# Patient Record
Sex: Female | Born: 1970 | Race: Black or African American | Hispanic: No | Marital: Single | Smoking: Never smoker
Health system: Southern US, Community
[De-identification: ages and names within clinical notes are randomized; demographics above are authoritative.]

## PROBLEM LIST (undated history)

## (undated) DIAGNOSIS — Z9889 Other specified postprocedural states: Secondary | ICD-10-CM

## (undated) DIAGNOSIS — N840 Polyp of corpus uteri: Secondary | ICD-10-CM

## (undated) DIAGNOSIS — D259 Leiomyoma of uterus, unspecified: Secondary | ICD-10-CM

## (undated) DIAGNOSIS — R112 Nausea with vomiting, unspecified: Secondary | ICD-10-CM

## (undated) DIAGNOSIS — N920 Excessive and frequent menstruation with regular cycle: Secondary | ICD-10-CM

## (undated) DIAGNOSIS — Z6721 Type B blood, Rh negative: Secondary | ICD-10-CM

## (undated) DIAGNOSIS — S52571A Other intraarticular fracture of lower end of right radius, initial encounter for closed fracture: Secondary | ICD-10-CM

## (undated) DIAGNOSIS — D649 Anemia, unspecified: Secondary | ICD-10-CM

## (undated) DIAGNOSIS — R9389 Abnormal findings on diagnostic imaging of other specified body structures: Secondary | ICD-10-CM

## (undated) HISTORY — DX: Type B blood, Rh negative: Z67.21

## (undated) HISTORY — DX: Anemia, unspecified: D64.9

## (undated) HISTORY — PX: WISDOM TOOTH EXTRACTION: SHX21

---

## 1998-04-09 ENCOUNTER — Encounter: Payer: Self-pay | Admitting: Emergency Medicine

## 1998-04-09 ENCOUNTER — Emergency Department (HOSPITAL_COMMUNITY): Admission: EM | Admit: 1998-04-09 | Discharge: 1998-04-09 | Payer: Self-pay | Admitting: Emergency Medicine

## 1998-05-14 ENCOUNTER — Other Ambulatory Visit: Admission: RE | Admit: 1998-05-14 | Discharge: 1998-05-14 | Payer: Self-pay | Admitting: Obstetrics and Gynecology

## 1999-06-25 ENCOUNTER — Other Ambulatory Visit: Admission: RE | Admit: 1999-06-25 | Discharge: 1999-06-25 | Payer: Self-pay | Admitting: Gynecology

## 1999-11-15 ENCOUNTER — Other Ambulatory Visit: Admission: RE | Admit: 1999-11-15 | Discharge: 1999-11-15 | Payer: Self-pay | Admitting: Gynecology

## 1999-12-23 ENCOUNTER — Inpatient Hospital Stay (HOSPITAL_COMMUNITY): Admission: AD | Admit: 1999-12-23 | Discharge: 1999-12-26 | Payer: Self-pay | Admitting: Gynecology

## 2000-07-28 ENCOUNTER — Emergency Department (HOSPITAL_COMMUNITY): Admission: EM | Admit: 2000-07-28 | Discharge: 2000-07-28 | Payer: Self-pay | Admitting: Emergency Medicine

## 2000-11-23 ENCOUNTER — Other Ambulatory Visit: Admission: RE | Admit: 2000-11-23 | Discharge: 2000-11-23 | Payer: Self-pay | Admitting: Gynecology

## 2002-02-09 ENCOUNTER — Emergency Department (HOSPITAL_COMMUNITY): Admission: EM | Admit: 2002-02-09 | Discharge: 2002-02-10 | Payer: Self-pay | Admitting: Emergency Medicine

## 2002-02-10 ENCOUNTER — Encounter: Payer: Self-pay | Admitting: Emergency Medicine

## 2003-03-13 ENCOUNTER — Other Ambulatory Visit: Admission: RE | Admit: 2003-03-13 | Discharge: 2003-03-13 | Payer: Self-pay | Admitting: Gynecology

## 2003-04-28 ENCOUNTER — Emergency Department (HOSPITAL_COMMUNITY): Admission: EM | Admit: 2003-04-28 | Discharge: 2003-04-28 | Payer: Self-pay | Admitting: Emergency Medicine

## 2004-05-24 ENCOUNTER — Other Ambulatory Visit: Admission: RE | Admit: 2004-05-24 | Discharge: 2004-05-24 | Payer: Self-pay | Admitting: Obstetrics and Gynecology

## 2004-12-27 ENCOUNTER — Encounter: Admission: RE | Admit: 2004-12-27 | Discharge: 2004-12-27 | Payer: Self-pay | Admitting: Gynecology

## 2005-03-22 ENCOUNTER — Emergency Department (HOSPITAL_COMMUNITY): Admission: EM | Admit: 2005-03-22 | Discharge: 2005-03-22 | Payer: Self-pay | Admitting: Emergency Medicine

## 2005-10-06 ENCOUNTER — Other Ambulatory Visit: Admission: RE | Admit: 2005-10-06 | Discharge: 2005-10-06 | Payer: Self-pay | Admitting: Gynecology

## 2005-10-17 ENCOUNTER — Emergency Department (HOSPITAL_COMMUNITY): Admission: EM | Admit: 2005-10-17 | Discharge: 2005-10-18 | Payer: Self-pay | Admitting: Emergency Medicine

## 2006-11-16 ENCOUNTER — Other Ambulatory Visit: Admission: RE | Admit: 2006-11-16 | Discharge: 2006-11-16 | Payer: Self-pay | Admitting: Gynecology

## 2007-11-21 ENCOUNTER — Other Ambulatory Visit: Admission: RE | Admit: 2007-11-21 | Discharge: 2007-11-21 | Payer: Self-pay | Admitting: Gynecology

## 2008-03-04 ENCOUNTER — Emergency Department (HOSPITAL_COMMUNITY): Admission: EM | Admit: 2008-03-04 | Discharge: 2008-03-04 | Payer: Self-pay | Admitting: Emergency Medicine

## 2008-12-11 ENCOUNTER — Encounter: Payer: Self-pay | Admitting: Women's Health

## 2008-12-11 ENCOUNTER — Ambulatory Visit: Payer: Self-pay | Admitting: Women's Health

## 2008-12-11 ENCOUNTER — Other Ambulatory Visit: Admission: RE | Admit: 2008-12-11 | Discharge: 2008-12-11 | Payer: Self-pay | Admitting: Gynecology

## 2009-05-12 ENCOUNTER — Ambulatory Visit: Payer: Self-pay | Admitting: Gynecology

## 2009-05-13 ENCOUNTER — Encounter: Admission: RE | Admit: 2009-05-13 | Discharge: 2009-05-13 | Payer: Self-pay | Admitting: Gynecology

## 2009-12-16 ENCOUNTER — Ambulatory Visit: Payer: Self-pay | Admitting: Women's Health

## 2009-12-16 ENCOUNTER — Other Ambulatory Visit: Admission: RE | Admit: 2009-12-16 | Discharge: 2009-12-16 | Payer: Self-pay | Admitting: Gynecology

## 2010-04-23 ENCOUNTER — Other Ambulatory Visit: Payer: Self-pay | Admitting: Gynecology

## 2010-04-23 DIAGNOSIS — Z1239 Encounter for other screening for malignant neoplasm of breast: Secondary | ICD-10-CM

## 2010-05-20 ENCOUNTER — Ambulatory Visit: Payer: Self-pay

## 2010-06-02 ENCOUNTER — Ambulatory Visit: Payer: Self-pay

## 2010-06-08 ENCOUNTER — Ambulatory Visit
Admission: RE | Admit: 2010-06-08 | Discharge: 2010-06-08 | Disposition: A | Discharge status: Home / Self Care | Payer: BC Managed Care – PPO | Source: Ambulatory Visit | Attending: Gynecology | Admitting: Gynecology

## 2010-06-08 DIAGNOSIS — Z1239 Encounter for other screening for malignant neoplasm of breast: Secondary | ICD-10-CM

## 2010-08-13 NOTE — Discharge Summary (Signed)
[facility]  Patient:    Courtney Bowman, Courtney Bowman                      MRN: [id number] Adm. Date:  [id number] Disc. Date: [id number] Attending:  Tonye Royalty Dictator:   Antony Contras, [facility]                           Discharge Summary  DISCHARGE DIAGNOSES:          1. Intrauterine pregnancy at term.                               2. History of previous cesarean section.                               3. Rh-negative.  PROCEDURES:                   Low cervical transverse cesarean section with delivery of viable infant.  HISTORY OF PRESENT ILLNESS:   The patient is a 40 year old gravida 3, para 1-0-1-1 with an uncertain LMP of March 29, 1999, [facility] December 27, 1999 per ultrasound.  Prenatal risk factors include history of previous cesarean section.  The patient is also Rh-negative.  PRENATAL LABORATORY:          Blood type B-; antibody screen negative; sickle cell trait negative; RPR, HBsAg, HIV nonreactive; rubella immune; MSAFP is normal; GBS is negative.  HOSPITAL COURSE/TREATMENT:    The patient was admitted for repeat cesarean section on December 23, 1999, which was performed by Dr. Lily Peer, assisted by Dr. Penni Homans.  She was delivered of a female infant, Apgars 8 and 9 weighing 8 pounds 7 ounces.  Normal pelvic anatomy.  POSTOPERATIVE COURSE:         She remained afebrile.  Had no difficulty voiding.  Was able to be discharged on her third postoperative day in satisfactory condition.  CBC:  Hematocrit 32.1, hemoglobin 11.4, WBCs 14.5, platelets 206.  Infant was B negative, so she did not require RhoGAM.  DISPOSITION:                  Follow-up in the office in six weeks.  Continue prenatal vitamins and iron.  Motrin and Tylox for pain. DD:  01/14/00 TD:  01/14/00 Job: [id number]

## 2010-08-13 NOTE — H&P (Signed)
[facility]  Patient:    Courtney Bowman, Courtney Bowman                        MRN: [id number] Adm. Date:  12/23/99 Attending:  Gaetano Hawthorne. Lily Peer, M.D.                         History and Physical  CHIEF COMPLAINT:              1. Term intrauterine pregnancy.                               2. Previous cesarean section requesting elective                                  repeat.  HISTORY OF PRESENT ILLNESS:   The patient is a 40 year old, gravida 3, para 1, AB 1, currently 39-2/7ths weeks gestation who had a cesarean section with her last pregnancy secondary to failure to progress after 10 hours of labor, also fetal tachycardia and chorioamnionitis.  Patient had been provided with literature information as well as discussion of option for trial of labor versus repeat cesarean section; and, since her last baby weighed at 8 pounds, 4 ounces, and she had an ultrasound in the office on September 25 and the weight of the fetus is near the weight of the last pregnancy, they have decided to proceed with a repeat cesarean section.  They were uncertain at the time of the last office visit and would let me know right before the cesarean section if they wished to proceed with a tubal sterilization procedure at time of her repeat cesarean section.  Prenatal course has essentially been unremarkable except that she received RhoGAM at [redacted] weeks gestation and had anemia which she was supplemented with iron supplementation.  PAST MEDICAL HISTORY:         Previous cesarean section secondary to failure to progress in 1996 a viable female infant, 8 pounds, 4 ounces, and then, in March 2000, she had a therapeutic AB at five weeks gestation.  Patient denies any allergies.  REVIEW OF SYSTEMS:            See ______ form.  PHYSICAL EXAMINATION:  VITAL SIGNS:                  Blood pressure 120/70.  Urine was negative. Weight 193 pounds.  HEENT:                         Unremarkable.  NECK:                         Supple.  Trachea midline.  No carotid bruits. No thyromegaly.  LUNGS:                        Clear to auscultation without rhonchi or wheezes.  HEART:                        Regular rate and rhythm.  No murmurs or gallops.  BREASTS:                      Examination was done during the first  trimester and was reported to be normal.  ABDOMEN:                      Gravid uterus.  Fundal height 38 cm.  Vertex presentation by [facility] maneuver.  Positive fetal heart tones.  PELVIC:                       Cervix is long, closed, and posterior. Presenting part not engaged.  EXTREMITIES:                  DTRs 1+.  Negative clonus.  PRENATAL LABORATORIES:        B negative blood type and negative antibody screen.  Sickle cell trait was negative.  VDRL was nonreactive.  Hepatitis B surface antigen and HIV were nonreactive.  Alpha-fetoprotein was normal.  Pap smear:  Atypical squamous cells suspicious for CIN I.  Patient had a follow-up Pap smear which demonstrated nonreactive repetitive changes.  No significant atypia.  Alpha-fetoprotein was normal, group B strep culture was negative, and diabetes screen was normal.  She did have evidence of anemia.  ASSESSMENT:                   A 40 year old, gravida 3, para 1, AB 1 at 39-2/[redacted] weeks gestation with previous cesarean section with failure to progress. Requested elective repeat.  Patient will let me know prior to her surgery if her and her husband have decided to proceed with elective permanent sterilization for which she was counseled as to risks, benefits, pros and cons of procedure as outlined.  If she does decide to proceed with a sterilization procedure, she is fully aware that she will not be able to have any more children.  All questions were answered and will follow accordingly.  PLAN:                         Patient is scheduled for a repeat cesarean section tomorrow, September 27, at 1  p.m. at [facility], possible bilateral tubal sterilization. DD:  12/22/99 TD:  12/22/99 Job: [id number]

## 2011-06-02 ENCOUNTER — Other Ambulatory Visit: Payer: Self-pay | Admitting: Gynecology

## 2011-06-02 DIAGNOSIS — Z1231 Encounter for screening mammogram for malignant neoplasm of breast: Secondary | ICD-10-CM

## 2011-06-14 ENCOUNTER — Ambulatory Visit
Admission: RE | Admit: 2011-06-14 | Discharge: 2011-06-14 | Disposition: A | Discharge status: Home / Self Care | Payer: 59 | Source: Ambulatory Visit | Attending: Gynecology | Admitting: Gynecology

## 2011-06-14 DIAGNOSIS — Z1231 Encounter for screening mammogram for malignant neoplasm of breast: Secondary | ICD-10-CM

## 2011-07-24 ENCOUNTER — Other Ambulatory Visit: Payer: Self-pay | Admitting: Women's Health

## 2011-10-03 ENCOUNTER — Ambulatory Visit: Payer: 59 | Admitting: Women's Health

## 2011-10-07 ENCOUNTER — Encounter: Payer: Self-pay | Admitting: Women's Health

## 2011-10-07 DIAGNOSIS — Z6721 Type B blood, Rh negative: Secondary | ICD-10-CM | POA: Insufficient documentation

## 2011-10-07 DIAGNOSIS — D649 Anemia, unspecified: Secondary | ICD-10-CM | POA: Insufficient documentation

## 2011-10-13 ENCOUNTER — Encounter: Payer: Self-pay | Admitting: Women's Health

## 2011-10-13 ENCOUNTER — Other Ambulatory Visit (HOSPITAL_COMMUNITY)
Admission: RE | Admit: 2011-10-13 | Discharge: 2011-10-13 | Disposition: A | Discharge status: Home / Self Care | Payer: 59 | Source: Ambulatory Visit | Attending: Women's Health | Admitting: Women's Health

## 2011-10-13 ENCOUNTER — Ambulatory Visit (INDEPENDENT_AMBULATORY_CARE_PROVIDER_SITE_OTHER): Payer: 59 | Admitting: Women's Health

## 2011-10-13 VITALS — BP 140/90 | Ht 67.5 in | Wt 185.0 lb

## 2011-10-13 DIAGNOSIS — Z01419 Encounter for gynecological examination (general) (routine) without abnormal findings: Secondary | ICD-10-CM | POA: Insufficient documentation

## 2011-10-13 DIAGNOSIS — Z833 Family history of diabetes mellitus: Secondary | ICD-10-CM

## 2011-10-13 DIAGNOSIS — E079 Disorder of thyroid, unspecified: Secondary | ICD-10-CM

## 2011-10-13 DIAGNOSIS — R87810 Cervical high risk human papillomavirus (HPV) DNA test positive: Secondary | ICD-10-CM | POA: Insufficient documentation

## 2011-10-13 LAB — CBC WITH DIFFERENTIAL/PLATELET
Basophils Absolute: 0.1 10*3/uL (ref 0.0–0.1)
Eosinophils Absolute: 0.2 10*3/uL (ref 0.0–0.7)
Eosinophils Relative: 2 % (ref 0–5)
HCT: 34.2 % — ABNORMAL LOW (ref 36.0–46.0)
Lymphocytes Relative: 33 % (ref 12–46)
MCH: 25.2 pg — ABNORMAL LOW (ref 26.0–34.0)
MCV: 79 fL (ref 78.0–100.0)
Monocytes Absolute: 0.5 10*3/uL (ref 0.1–1.0)
Platelets: 328 10*3/uL (ref 150–400)
RDW: 14.8 % (ref 11.5–15.5)
WBC: 9.3 10*3/uL (ref 4.0–10.5)

## 2011-10-13 NOTE — Progress Notes (Signed)
Courtney Bowman 12/20/1970 [id number]    History:    The patient presents for annual exam.  Monthly 5 day cycle had been on Loestrin 1/20/same partner. History of all normal Paps, last Pap 9/11. Has not had annual mammogram. Blood pressure today 140/90.   Past medical history, past surgical history, family history and social history were all reviewed and documented in the EPIC chart. Stressful job with insurance. Daughter Toni Amend 52, son [name] 4 both doing well.   ROS:  A  ROS was performed and pertinent positives and negatives are included in the history.  Exam:  Filed Vitals:   10/13/11 1602  BP: 140/90    General appearance:  Normal Head/Neck:  Normal, without cervical or supraclavicular adenopathy. Thyroid:  Symmetrical, normal in size, without palpable masses or nodularity. Respiratory  Effort:  Normal  Auscultation:  Clear without wheezing or rhonchi Cardiovascular  Auscultation:  Regular rate, without rubs, murmurs or gallops  Edema/varicosities:  Not grossly evident Abdominal  Soft,nontender, without masses, guarding or rebound.  Liver/spleen:  No organomegaly noted  Hernia:  None appreciated  Skin  Inspection:  Grossly normal  Palpation:  Grossly normal Neurologic/psychiatric  Orientation:  Normal with appropriate conversation.  Mood/affect:  Normal  Genitourinary    Breasts: Examined lying and sitting.     Right: Without masses, retractions, discharge or axillary adenopathy.     Left: Without masses, retractions, discharge or axillary adenopathy.   Inguinal/mons:  Normal without inguinal adenopathy  External genitalia:  Normal  BUS/Urethra/Skene's glands:  Normal  Bladder:  Normal  Vagina:  Normal  Cervix:  Normal  Uterus:   normal in size, shape and contour.  Midline and mobile  Adnexa/parametria:     Rt: Without masses or tenderness.   Lt: Without masses or tenderness.  Anus and perineum: Normal  Digital rectal exam: Normal sphincter tone without  palpated masses or tenderness  Assessment/Plan:  41 y.o. SBF G3 P2 for annual exam without complaint other than stressful job.  Normal GYN exam Blood pressure 140/90 History of anemia  Plan: Currently not sexually active, condoms encouraged if becomes. Contraception options reviewed reviewed no birth control pills due blood pressure. Monitor blood pressure away from office, if continues greater than 130/80 to seek care with primary care for blood pressure management. Reviewed importance of increasing regular exercise, weight loss, low-fat and low-salt diet. SBE's, schedule annual mammogram, calcium rich diet, vitamin D 1000 daily, MVI daily with iron encouraged. CBC, TSH, glucose, UA and Pap with HR HPV. HIV per patient's request. Reviewed new screening guidelines.    Harrington Challenger WHNP, 5:10 PM 10/13/2011

## 2011-10-13 NOTE — Patient Instructions (Addendum)

## 2011-10-14 LAB — URINALYSIS W MICROSCOPIC + REFLEX CULTURE
Bilirubin Urine: NEGATIVE
Casts: NONE SEEN
Glucose, UA: NEGATIVE mg/dL
Hgb urine dipstick: NEGATIVE
Leukocytes, UA: NEGATIVE
pH: 6.5 (ref 5.0–8.0)

## 2011-10-14 LAB — GLUCOSE, RANDOM: Glucose, Bld: 100 mg/dL — ABNORMAL HIGH (ref 70–99)

## 2011-11-08 ENCOUNTER — Encounter: Payer: Self-pay | Admitting: Gynecology

## 2011-11-08 ENCOUNTER — Ambulatory Visit (INDEPENDENT_AMBULATORY_CARE_PROVIDER_SITE_OTHER): Payer: 59 | Admitting: Gynecology

## 2011-11-08 DIAGNOSIS — R87810 Cervical high risk human papillomavirus (HPV) DNA test positive: Secondary | ICD-10-CM

## 2011-11-08 DIAGNOSIS — R87610 Atypical squamous cells of undetermined significance on cytologic smear of cervix (ASC-US): Secondary | ICD-10-CM

## 2011-11-08 NOTE — Progress Notes (Signed)
Patient ID: Courtney Bowman, female   DOB: [birth year], 41 y.o.   MRN: [id number] Patient presents with first abnormal Pap smear showing ASCUS with positive high-risk HPV. She has had numerous normal reports in the past. She currently is not sexually active.  Exam is Amy Asst. External BUS vagina normal. Cervix grossly normal.   Colposcopy after acetic acid cleanse adequate although transformation zone at the os requiring manipulation to visualize. Area of micropapillary changes 4 oclock off the transformations. Rep. Biopsy taken with ECC.  Patient tolerated well.  Physical Exam  Genitourinary:     Assessment and plan: First abnormal Pap smear showing ASCUS with positive high-risk HPV. Colposcopy was small area consistent with micropapillary HPV change. Rep. Biopsy taken. Patient will follow up for 80 cc/biopsy results. If negative then plan expected management with repeat Pap/HPV in 1 year. If otherwise then we'll triage based on results.

## 2011-11-08 NOTE — Patient Instructions (Signed)
Office will contact you with the biopsy results

## 2011-11-10 ENCOUNTER — Other Ambulatory Visit: Payer: Self-pay | Admitting: Obstetrics and Gynecology

## 2011-11-10 ENCOUNTER — Encounter: Payer: Self-pay | Admitting: Women's Health

## 2011-11-10 ENCOUNTER — Encounter: Payer: Self-pay | Admitting: Obstetrics and Gynecology

## 2011-11-10 ENCOUNTER — Telehealth: Payer: Self-pay | Admitting: Women's Health

## 2011-11-10 DIAGNOSIS — N390 Urinary tract infection, site not specified: Secondary | ICD-10-CM

## 2011-11-10 MED ORDER — NITROFURANTOIN MONOHYD MACRO 100 MG PO CAPS: 100.0000 mg | ORAL_CAPSULE | Freq: Two times a day (BID) | ORAL | Status: DC

## 2011-11-10 NOTE — Telephone Encounter (Signed)
Message left to call in regards to biopsy results.

## 2011-11-10 NOTE — Telephone Encounter (Signed)
Message copied by Harrington Challenger on Thu Nov 10, 2011  5:00 PM ------      Message from: Keenan Bachelor      Created: Thu Nov 10, 2011  3:20 PM       Harriett Sine, would you mind calling this patient of yours about her biopsy results. I am not comfortable trying to explain and reassure. Thanks so much!!  KA

## 2011-11-10 NOTE — Progress Notes (Unsigned)
In error (was intended for a different patient)  I prescribed Macrobid to patient's [employer]. I realized my error within a minute but it was sent electronically. I cancelled in system and I called [employer]. Spoke with Joe in patient accounts and he said when it comes through he will cancel it and/or he will also tell pharmacist to be on the lookout for it and cancel it.

## 2011-11-10 NOTE — Addendum Note (Signed)
Addended by: Keenan Bachelor on: 11/10/2011 02:28 PM   Modules accepted: Orders

## 2011-11-11 ENCOUNTER — Encounter: Payer: Self-pay | Admitting: Women's Health

## 2011-11-11 DIAGNOSIS — IMO0002 Reserved for concepts with insufficient information to code with codable children: Secondary | ICD-10-CM | POA: Insufficient documentation

## 2012-02-09 ENCOUNTER — Other Ambulatory Visit: Payer: Self-pay | Admitting: Women's Health

## 2012-02-09 ENCOUNTER — Telehealth: Payer: Self-pay | Admitting: Women's Health

## 2012-02-09 DIAGNOSIS — N920 Excessive and frequent menstruation with regular cycle: Secondary | ICD-10-CM

## 2012-02-09 MED ORDER — NORETHIN ACE-ETH ESTRAD-FE 1-20 MG-MCG PO TABS: 1.0000 | ORAL_TABLET | Freq: Every day | ORAL | Status: DC

## 2012-02-09 NOTE — Telephone Encounter (Signed)
Telephone call, states had followup with primary care and blood pressure was 120/70, states has been checking away from office and B/P has been normal. States stressors at work are less. States periods are coming every 2-1/2-3 weeks, had a normal TSH July 2013, requests to go back on combination birth control pills for better predictability of cycle.. Will  take for 3 months, office visit to evaluate blood pressure. Condoms encouraged if becomes sexually active. Reviewed risks of blood clots and strokes with combination birth control pills especially with elevated blood pressure.

## 2012-02-09 NOTE — Telephone Encounter (Signed)
Patient called and stated that ever since [place] took her off her bcp's that her menses are twice monthly. Normal periods but twice a month.  I asked her why Harriett Sine "took her off" and she did not recall why she had to stop them.  I went back and read her from [place]'s note regarding no oc's because of BP.  She said her BP has been normal.  I asked had she been checking it or having it checked by physician and she said yes and it was normal.

## 2012-02-15 ENCOUNTER — Telehealth: Payer: Self-pay | Admitting: *Deleted

## 2012-02-15 NOTE — Telephone Encounter (Signed)
Pt had questions about birth control pills pt received from mail order generic of loestrin fe 1/20. Pt asked if okay to take, I told pt yes it is the same pill.

## 2012-06-04 ENCOUNTER — Other Ambulatory Visit: Payer: Self-pay

## 2012-07-10 ENCOUNTER — Ambulatory Visit
Admission: RE | Admit: 2012-07-10 | Discharge: 2012-07-10 | Disposition: A | Discharge status: Home / Self Care | Payer: 59 | Source: Ambulatory Visit

## 2012-07-10 DIAGNOSIS — Z1231 Encounter for screening mammogram for malignant neoplasm of breast: Secondary | ICD-10-CM

## 2012-08-01 ENCOUNTER — Other Ambulatory Visit: Payer: Self-pay | Admitting: Women's Health

## 2012-10-22 ENCOUNTER — Ambulatory Visit (INDEPENDENT_AMBULATORY_CARE_PROVIDER_SITE_OTHER): Payer: 59 | Admitting: Women's Health

## 2012-10-22 ENCOUNTER — Other Ambulatory Visit (HOSPITAL_COMMUNITY)
Admission: RE | Admit: 2012-10-22 | Discharge: 2012-10-22 | Disposition: A | Discharge status: Home / Self Care | Payer: 59 | Source: Ambulatory Visit | Attending: Women's Health | Admitting: Women's Health

## 2012-10-22 ENCOUNTER — Encounter: Payer: Self-pay | Admitting: Women's Health

## 2012-10-22 VITALS — BP 132/82 | Ht 67.0 in | Wt 184.0 lb

## 2012-10-22 DIAGNOSIS — Z01419 Encounter for gynecological examination (general) (routine) without abnormal findings: Secondary | ICD-10-CM

## 2012-10-22 DIAGNOSIS — Z833 Family history of diabetes mellitus: Secondary | ICD-10-CM

## 2012-10-22 DIAGNOSIS — Z1151 Encounter for screening for human papillomavirus (HPV): Secondary | ICD-10-CM | POA: Insufficient documentation

## 2012-10-22 DIAGNOSIS — IMO0001 Reserved for inherently not codable concepts without codable children: Secondary | ICD-10-CM

## 2012-10-22 DIAGNOSIS — Z13220 Encounter for screening for lipoid disorders: Secondary | ICD-10-CM

## 2012-10-22 DIAGNOSIS — Z309 Encounter for contraceptive management, unspecified: Secondary | ICD-10-CM

## 2012-10-22 DIAGNOSIS — R87810 Cervical high risk human papillomavirus (HPV) DNA test positive: Secondary | ICD-10-CM | POA: Insufficient documentation

## 2012-10-22 LAB — CBC WITH DIFFERENTIAL/PLATELET
Basophils Relative: 1 % (ref 0–1)
HCT: 26.9 % — ABNORMAL LOW (ref 36.0–46.0)
Hemoglobin: 7.9 g/dL — ABNORMAL LOW (ref 12.0–15.0)
Lymphocytes Relative: 31 % (ref 12–46)
Lymphs Abs: 2.7 10*3/uL (ref 0.7–4.0)
MCHC: 29.4 g/dL — ABNORMAL LOW (ref 30.0–36.0)
Monocytes Absolute: 0.5 10*3/uL (ref 0.1–1.0)
Monocytes Relative: 6 % (ref 3–12)
Neutro Abs: 5.4 10*3/uL (ref 1.7–7.7)
Neutrophils Relative %: 59 % (ref 43–77)
RBC: 4.25 MIL/uL (ref 3.87–5.11)

## 2012-10-22 MED ORDER — NORETHIN ACE-ETH ESTRAD-FE 1-20 MG-MCG PO TABS: ORAL_TABLET | ORAL | Status: DC

## 2012-10-22 NOTE — Patient Instructions (Addendum)

## 2012-10-22 NOTE — Progress Notes (Signed)
Courtney Bowman 10-Apr-1970 [id number]    History:    The patient presents for annual exam.  Regular monthly cycle on Loestrin 1/20. Normal Pap history until 7/ 2013 had ascus with positive HR HPV with  biopsy confirming HPV effects. Normal mammogram history.   Past medical history, past surgical history, family history and social history were all reviewed and documented in the EPIC chart. Has insurance business. Courtney 16, Courtney Bowman 11 both doing well. Father died of multiple myeloma.   ROS:  A  ROS was performed and pertinent positives and negatives are included in the history.  Exam:  Filed Vitals:   10/22/12 1552  BP: 132/82    General appearance:  Normal Head/Neck:  Normal, without cervical or supraclavicular adenopathy. Thyroid:  Symmetrical, normal in size, without palpable masses or nodularity. Respiratory  Effort:  Normal  Auscultation:  Clear without wheezing or rhonchi Cardiovascular  Auscultation:  Regular rate, without rubs, murmurs or gallops  Edema/varicosities:  Not grossly evident Abdominal  Soft,nontender, without masses, guarding or rebound.  Liver/spleen:  No organomegaly noted  Hernia:  None appreciated  Skin  Inspection:  Grossly normal  Palpation:  Grossly normal Neurologic/psychiatric  Orientation:  Normal with appropriate conversation.  Mood/affect:  Normal  Genitourinary    Breasts: Examined lying and sitting.     Right: Without masses, retractions, discharge or axillary adenopathy.     Left: Without masses, retractions, discharge or axillary adenopathy.   Inguinal/mons:  Normal without inguinal adenopathy  External genitalia:  Normal  BUS/Urethra/Skene's glands:  Normal  Bladder:  Normal  Vagina:  Normal  Cervix:  Normal  Uterus:   normal in size, shape and contour.  Midline and mobile  Adnexa/parametria:     Rt: Without masses or tenderness.   Lt: Without masses or tenderness.  Anus and perineum: Normal  Digital rectal exam: Normal  sphincter tone without palpated masses or tenderness  Assessment/Plan:  42 y.o. DBF G2P2 for annual exam with no complaints.  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 Reviewed importance of increasing exercise and decreasing calories for weight loss for health. CBC, lipid panel, glucose, UA, Pap with HPV typing.    Courtney Bowman [facility], 4:27 PM 10/22/2012

## 2012-10-23 LAB — URINALYSIS W MICROSCOPIC + REFLEX CULTURE
Bilirubin Urine: NEGATIVE
Glucose, UA: NEGATIVE mg/dL
Ketones, ur: NEGATIVE mg/dL
Protein, ur: NEGATIVE mg/dL
Urobilinogen, UA: 1 mg/dL (ref 0.0–1.0)

## 2012-10-23 LAB — LIPID PANEL
Cholesterol: 143 mg/dL (ref 0–200)
HDL: 43 mg/dL (ref >39)
Total CHOL/HDL Ratio: 3.3 Ratio
Triglycerides: 187 mg/dL — ABNORMAL HIGH (ref <150)
VLDL: 37 mg/dL (ref 0–40)

## 2012-10-23 LAB — GLUCOSE, RANDOM: Glucose, Bld: 90 mg/dL (ref 70–99)

## 2012-10-24 LAB — URINE CULTURE: Organism ID, Bacteria: NO GROWTH

## 2012-10-26 ENCOUNTER — Other Ambulatory Visit: Payer: Self-pay | Admitting: Women's Health

## 2012-10-26 DIAGNOSIS — D649 Anemia, unspecified: Secondary | ICD-10-CM

## 2012-12-24 ENCOUNTER — Telehealth: Payer: Self-pay | Admitting: *Deleted

## 2012-12-24 NOTE — Telephone Encounter (Signed)
Pt informed with the below note. 

## 2012-12-24 NOTE — Telephone Encounter (Signed)
(  Pt aware you are out of the office) pt called requesting Rx for diet pills? Pt said she spoke with you about this at last OV. Please advise

## 2012-12-24 NOTE — Telephone Encounter (Signed)
No diet pills, many risks with B/P and heart and statistically not significant with long term results, wt watchers better, or [employer], low carb diet,

## 2013-07-04 ENCOUNTER — Other Ambulatory Visit: Payer: Self-pay

## 2013-07-04 DIAGNOSIS — Z1231 Encounter for screening mammogram for malignant neoplasm of breast: Secondary | ICD-10-CM

## 2013-07-15 ENCOUNTER — Ambulatory Visit
Admission: RE | Admit: 2013-07-15 | Discharge: 2013-07-15 | Disposition: A | Discharge status: Home / Self Care | Payer: 59 | Source: Ambulatory Visit

## 2013-07-15 DIAGNOSIS — Z1231 Encounter for screening mammogram for malignant neoplasm of breast: Secondary | ICD-10-CM

## 2014-01-27 ENCOUNTER — Encounter: Payer: Self-pay | Admitting: Women's Health

## 2014-02-06 ENCOUNTER — Encounter: Payer: 59 | Admitting: Women's Health

## 2014-02-07 ENCOUNTER — Other Ambulatory Visit (HOSPITAL_COMMUNITY)
Admission: RE | Admit: 2014-02-07 | Discharge: 2014-02-07 | Disposition: A | Discharge status: Home / Self Care | Payer: 59 | Source: Ambulatory Visit | Attending: Women's Health | Admitting: Women's Health

## 2014-02-07 ENCOUNTER — Ambulatory Visit (INDEPENDENT_AMBULATORY_CARE_PROVIDER_SITE_OTHER): Payer: 59 | Admitting: Women's Health

## 2014-02-07 ENCOUNTER — Encounter: Payer: Self-pay | Admitting: Women's Health

## 2014-02-07 VITALS — BP 140/80 | Ht 67.0 in | Wt 181.0 lb

## 2014-02-07 DIAGNOSIS — Z1151 Encounter for screening for human papillomavirus (HPV): Secondary | ICD-10-CM | POA: Insufficient documentation

## 2014-02-07 DIAGNOSIS — Z01411 Encounter for gynecological examination (general) (routine) with abnormal findings: Secondary | ICD-10-CM | POA: Diagnosis not present

## 2014-02-07 DIAGNOSIS — Z13220 Encounter for screening for lipoid disorders: Secondary | ICD-10-CM

## 2014-02-07 DIAGNOSIS — R87810 Cervical high risk human papillomavirus (HPV) DNA test positive: Secondary | ICD-10-CM | POA: Insufficient documentation

## 2014-02-07 DIAGNOSIS — Z113 Encounter for screening for infections with a predominantly sexual mode of transmission: Secondary | ICD-10-CM

## 2014-02-07 DIAGNOSIS — Z01419 Encounter for gynecological examination (general) (routine) without abnormal findings: Secondary | ICD-10-CM

## 2014-02-07 LAB — CBC WITH DIFFERENTIAL/PLATELET
Basophils Absolute: 0 10*3/uL (ref 0.0–0.1)
Basophils Relative: 0 % (ref 0–1)
Eosinophils Absolute: 0.2 10*3/uL (ref 0.0–0.7)
Eosinophils Relative: 2 % (ref 0–5)
HEMATOCRIT: 28.6 % — AB (ref 36.0–46.0)
HEMOGLOBIN: 8.6 g/dL — AB (ref 12.0–15.0)
LYMPHS ABS: 2.2 10*3/uL (ref 0.7–4.0)
LYMPHS PCT: 24 % (ref 12–46)
MCH: 20.1 pg — ABNORMAL LOW (ref 26.0–34.0)
MCHC: 30.1 g/dL (ref 30.0–36.0)
MCV: 66.8 fL — ABNORMAL LOW (ref 78.0–100.0)
MONO ABS: 0.5 10*3/uL (ref 0.1–1.0)
MONOS PCT: 6 % (ref 3–12)
NEUTROS ABS: 6.2 10*3/uL (ref 1.7–7.7)
Neutrophils Relative %: 68 % (ref 43–77)
Platelets: 370 10*3/uL (ref 150–400)
RBC: 4.28 MIL/uL (ref 3.87–5.11)
RDW: 16.9 % — ABNORMAL HIGH (ref 11.5–15.5)
WBC: 9.1 10*3/uL (ref 4.0–10.5)

## 2014-02-07 LAB — COMPREHENSIVE METABOLIC PANEL
ALT: 9 U/L (ref 0–35)
AST: 9 U/L (ref 0–37)
Albumin: 4 g/dL (ref 3.5–5.2)
Alkaline Phosphatase: 75 U/L (ref 39–117)
BUN: 10 mg/dL (ref 6–23)
CALCIUM: 9.4 mg/dL (ref 8.4–10.5)
CHLORIDE: 106 meq/L (ref 96–112)
CO2: 22 meq/L (ref 19–32)
CREATININE: 0.66 mg/dL (ref 0.50–1.10)
Glucose, Bld: 89 mg/dL (ref 70–99)
Potassium: 4.7 mEq/L (ref 3.5–5.3)
Sodium: 138 mEq/L (ref 135–145)
Total Bilirubin: 0.7 mg/dL (ref 0.2–1.2)
Total Protein: 6.8 g/dL (ref 6.0–8.3)

## 2014-02-07 LAB — LIPID PANEL
Cholesterol: 124 mg/dL (ref 0–200)
HDL: 44 mg/dL (ref >39)
LDL CALC: 59 mg/dL (ref 0–99)
TRIGLYCERIDES: 104 mg/dL (ref <150)
Total CHOL/HDL Ratio: 2.8 Ratio
VLDL: 21 mg/dL (ref 0–40)

## 2014-02-07 NOTE — Patient Instructions (Signed)

## 2014-02-07 NOTE — Progress Notes (Signed)
Courtney Bowman Jun 28, 1970 [id number]    History:    Presents for annual exam.  Regular monthly cycle/condoms. History of anemia has been taking iron several days weekly. 2013 Pap ascus with positive HR HPV with  Colposcopy, Consistent with Pap. 2014 Pap ascus with positive high risk HPV. Normal mammogram history.  Past medical history, past surgical history, family history and social history were all reviewed and documented in the EPIC chart. 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 Shea Stakes 13 doing well. Father died of multiple myeloma.  ROS:  A  12 point ROS was performed and pertinent positives and negatives are included.  Exam:  Filed Vitals:   02/07/14 1033  BP: 140/80    General appearance:  Normal Thyroid:  Symmetrical, normal in size, without palpable masses or nodularity. Respiratory  Auscultation:  Clear without wheezing or rhonchi Cardiovascular  Auscultation:  Regular rate, without rubs, murmurs or gallops  Edema/varicosities:  Not grossly evident Abdominal  Soft,nontender, without masses, guarding or rebound.  Liver/spleen:  No organomegaly noted  Hernia:  None appreciated  Skin  Inspection:  Grossly normal   Breasts: Examined lying and sitting.     Right: Without masses, retractions, discharge or axillary adenopathy.     Left: Without masses, retractions, discharge or axillary adenopathy. Gentitourinary   Inguinal/mons:  Normal without inguinal adenopathy  External genitalia:  Normal  BUS/Urethra/Skene's glands:  Normal  Vagina:  Normal  Cervix:  Normal  Uterus:   normal in size, shape and contour.  Midline and mobile  Adnexa/parametria:     Rt: Without masses or tenderness.   Lt: Without masses or tenderness.  Anus and perineum: Normal  Digital rectal exam: Normal sphincter tone without palpated masses or tenderness  Assessment/Plan:  43 y.o. SBF G2P2 for annual exam.    Monthly cycle/condoms History of anemia Pap ascus  with positive HR HPV STD screen  Plan: SBE's, continue annual mammogram, 3-D tomography reviewed and encouraged history of dense breast. CBC, CMP, lipid panel, UA, Pap with HR HPV typing. GC/Chlamydia, HIV, hep B, C, RPR. Condoms encouraged until permanent partner, declines other contraception options. Increase regular exercise and decrease calories for weight loss. Iron supplements, iron rich foods encouraged.    [place], 11:22 AM 02/07/2014

## 2014-02-08 LAB — URINALYSIS W MICROSCOPIC + REFLEX CULTURE
BILIRUBIN URINE: NEGATIVE
CRYSTALS: NONE SEEN
Casts: NONE SEEN
Glucose, UA: NEGATIVE mg/dL
Hgb urine dipstick: NEGATIVE
Ketones, ur: NEGATIVE mg/dL
Leukocytes, UA: NEGATIVE
Nitrite: NEGATIVE
Protein, ur: NEGATIVE mg/dL
SPECIFIC GRAVITY, URINE: 1.025 (ref 1.005–1.030)
UROBILINOGEN UA: 1 mg/dL (ref 0.0–1.0)
pH: 6.5 (ref 5.0–8.0)

## 2014-02-08 LAB — HEPATITIS B SURFACE ANTIGEN: Hepatitis B Surface Ag: NEGATIVE

## 2014-02-08 LAB — GC/CHLAMYDIA PROBE AMP
CT Probe RNA: NEGATIVE
GC Probe RNA: NEGATIVE

## 2014-02-08 LAB — HIV ANTIBODY (ROUTINE TESTING W REFLEX): HIV 1&2 Ab, 4th Generation: NONREACTIVE

## 2014-02-08 LAB — RPR

## 2014-02-08 LAB — HEPATITIS C ANTIBODY: HCV Ab: NEGATIVE

## 2014-02-11 LAB — URINE CULTURE
COLONY COUNT: NO GROWTH
Organism ID, Bacteria: NO GROWTH

## 2014-02-11 LAB — CYTOLOGY - PAP

## 2014-02-14 ENCOUNTER — Other Ambulatory Visit: Payer: Self-pay | Admitting: Women's Health

## 2014-02-14 DIAGNOSIS — D62 Acute posthemorrhagic anemia: Secondary | ICD-10-CM

## 2014-02-25 ENCOUNTER — Encounter (HOSPITAL_BASED_OUTPATIENT_CLINIC_OR_DEPARTMENT_OTHER): Payer: Self-pay | Admitting: *Deleted

## 2014-02-28 ENCOUNTER — Ambulatory Visit (HOSPITAL_BASED_OUTPATIENT_CLINIC_OR_DEPARTMENT_OTHER): Payer: 59 | Admitting: Anesthesiology

## 2014-02-28 ENCOUNTER — Encounter (HOSPITAL_BASED_OUTPATIENT_CLINIC_OR_DEPARTMENT_OTHER): Payer: Self-pay | Admitting: *Deleted

## 2014-02-28 ENCOUNTER — Encounter (HOSPITAL_BASED_OUTPATIENT_CLINIC_OR_DEPARTMENT_OTHER)
Admission: RE | Disposition: A | Discharge status: Home / Self Care | Payer: Self-pay | Source: Ambulatory Visit | Attending: Orthopedic Surgery

## 2014-02-28 ENCOUNTER — Ambulatory Visit (HOSPITAL_BASED_OUTPATIENT_CLINIC_OR_DEPARTMENT_OTHER)
Admission: RE | Admit: 2014-02-28 | Discharge: 2014-02-28 | Disposition: A | Discharge status: Home / Self Care | Payer: 59 | Source: Ambulatory Visit | Attending: Orthopedic Surgery | Admitting: Orthopedic Surgery

## 2014-02-28 DIAGNOSIS — D649 Anemia, unspecified: Secondary | ICD-10-CM | POA: Insufficient documentation

## 2014-02-28 DIAGNOSIS — S52501A Unspecified fracture of the lower end of right radius, initial encounter for closed fracture: Secondary | ICD-10-CM | POA: Diagnosis not present

## 2014-02-28 DIAGNOSIS — S52571A Other intraarticular fracture of lower end of right radius, initial encounter for closed fracture: Secondary | ICD-10-CM

## 2014-02-28 DIAGNOSIS — Z885 Allergy status to narcotic agent status: Secondary | ICD-10-CM | POA: Diagnosis not present

## 2014-02-28 DIAGNOSIS — Z807 Family history of other malignant neoplasms of lymphoid, hematopoietic and related tissues: Secondary | ICD-10-CM | POA: Diagnosis not present

## 2014-02-28 HISTORY — PX: OPEN REDUCTION INTERNAL FIXATION (ORIF) DISTAL RADIAL FRACTURE: SHX5989

## 2014-02-28 HISTORY — DX: Other intraarticular fracture of lower end of right radius, initial encounter for closed fracture: S52.571A

## 2014-02-28 LAB — POCT HEMOGLOBIN-HEMACUE: Hemoglobin: 7 g/dL — ABNORMAL LOW (ref 12.0–15.0)

## 2014-02-28 SURGERY — OPEN REDUCTION INTERNAL FIXATION (ORIF) DISTAL RADIUS FRACTURE
Anesthesia: Regional | Site: Wrist | Laterality: Right

## 2014-02-28 MED ORDER — LACTATED RINGERS IV SOLN
INTRAVENOUS | Status: DC
  Administered 2014-02-28 (×2): via INTRAVENOUS
  Administered 2014-02-28: 10 mL/h via INTRAVENOUS
  Administered 2014-02-28: 15:00:00 via INTRAVENOUS

## 2014-02-28 MED ORDER — OXYCODONE-ACETAMINOPHEN 5-325 MG PO TABS: 1.0000 | ORAL_TABLET | Freq: Four times a day (QID) | ORAL | Status: DC | PRN

## 2014-02-28 MED ORDER — MIDAZOLAM HCL 5 MG/5ML IJ SOLN
INTRAMUSCULAR | Status: DC | PRN
  Administered 2014-02-28: 2 mg via INTRAVENOUS

## 2014-02-28 MED ORDER — LIDOCAINE HCL (CARDIAC) 20 MG/ML IV SOLN
INTRAVENOUS | Status: DC | PRN
  Administered 2014-02-28: 100 mg via INTRAVENOUS

## 2014-02-28 MED ORDER — FENTANYL CITRATE 0.05 MG/ML IJ SOLN
INTRAMUSCULAR | Status: AC
  Filled 2014-02-28: qty 8

## 2014-02-28 MED ORDER — 0.9 % SODIUM CHLORIDE (POUR BTL) OPTIME
TOPICAL | Status: DC | PRN
  Administered 2014-02-28: 200 mL

## 2014-02-28 MED ORDER — SENNA-DOCUSATE SODIUM 8.6-50 MG PO TABS: 2.0000 | ORAL_TABLET | Freq: Every day | ORAL | Status: DC

## 2014-02-28 MED ORDER — OXYCODONE HCL 5 MG PO TABS: 5.0000 mg | ORAL_TABLET | Freq: Once | ORAL | Status: DC | PRN

## 2014-02-28 MED ORDER — ONDANSETRON HCL 4 MG/2ML IJ SOLN
4.0000 mg | Freq: Once | INTRAMUSCULAR | Status: AC | PRN
  Administered 2014-02-28: 4 mg via INTRAVENOUS

## 2014-02-28 MED ORDER — BUPIVACAINE-EPINEPHRINE (PF) 0.5% -1:200000 IJ SOLN
INTRAMUSCULAR | Status: DC | PRN
  Administered 2014-02-28: 30 mL via PERINEURAL

## 2014-02-28 MED ORDER — ONDANSETRON HCL 4 MG PO TABS: 4.0000 mg | ORAL_TABLET | Freq: Three times a day (TID) | ORAL | Status: DC | PRN

## 2014-02-28 MED ORDER — ONDANSETRON HCL 4 MG/2ML IJ SOLN
INTRAMUSCULAR | Status: DC | PRN
  Administered 2014-02-28: 4 mg via INTRAVENOUS

## 2014-02-28 MED ORDER — MIDAZOLAM HCL 2 MG/2ML IJ SOLN
INTRAMUSCULAR | Status: AC
  Filled 2014-02-28: qty 2

## 2014-02-28 MED ORDER — HYDROMORPHONE HCL 1 MG/ML IJ SOLN: 0.2500 mg | INTRAMUSCULAR | Status: DC | PRN

## 2014-02-28 MED ORDER — OXYCODONE HCL 5 MG/5ML PO SOLN: 5.0000 mg | Freq: Once | ORAL | Status: DC | PRN

## 2014-02-28 MED ORDER — CEFAZOLIN SODIUM-DEXTROSE 2-3 GM-% IV SOLR
2.0000 g | INTRAVENOUS | Status: AC
Start: 2014-03-01 — End: 2014-02-28
  Administered 2014-02-28: 2 g via INTRAVENOUS

## 2014-02-28 MED ORDER — EPHEDRINE SULFATE 50 MG/ML IJ SOLN
INTRAMUSCULAR | Status: DC | PRN
  Administered 2014-02-28: 10 mg via INTRAVENOUS

## 2014-02-28 MED ORDER — MEPERIDINE HCL 25 MG/ML IJ SOLN: 6.2500 mg | INTRAMUSCULAR | Status: DC | PRN

## 2014-02-28 MED ORDER — DEXAMETHASONE SODIUM PHOSPHATE 10 MG/ML IJ SOLN
INTRAMUSCULAR | Status: DC | PRN
  Administered 2014-02-28: 10 mg via INTRAVENOUS

## 2014-02-28 MED ORDER — FENTANYL CITRATE 0.05 MG/ML IJ SOLN
50.0000 ug | INTRAMUSCULAR | Status: DC | PRN
  Administered 2014-02-28: 100 ug via INTRAVENOUS

## 2014-02-28 MED ORDER — MIDAZOLAM HCL 2 MG/2ML IJ SOLN
1.0000 mg | INTRAMUSCULAR | Status: DC | PRN
  Administered 2014-02-28: 2 mg via INTRAVENOUS

## 2014-02-28 MED ORDER — PROPOFOL 10 MG/ML IV BOLUS
INTRAVENOUS | Status: DC | PRN
  Administered 2014-02-28: 200 mg via INTRAVENOUS

## 2014-02-28 MED ORDER — ONDANSETRON HCL 4 MG/2ML IJ SOLN
INTRAMUSCULAR | Status: AC
  Filled 2014-02-28: qty 2

## 2014-02-28 MED ORDER — FENTANYL CITRATE 0.05 MG/ML IJ SOLN
INTRAMUSCULAR | Status: DC | PRN
  Administered 2014-02-28 (×2): 50 ug via INTRAVENOUS

## 2014-02-28 MED ORDER — FENTANYL CITRATE 0.05 MG/ML IJ SOLN
INTRAMUSCULAR | Status: AC
  Filled 2014-02-28: qty 2

## 2014-02-28 SURGICAL SUPPLY — 77 items
APL SKNCLS STERI-STRIP NONHPOA (GAUZE/BANDAGES/DRESSINGS)
BANDAGE ELASTIC 3 VELCRO ST LF (GAUZE/BANDAGES/DRESSINGS) ×2 IMPLANT
BANDAGE ELASTIC 4 VELCRO ST LF (GAUZE/BANDAGES/DRESSINGS) IMPLANT
BENZOIN TINCTURE PRP APPL 2/3 (GAUZE/BANDAGES/DRESSINGS) ×1 IMPLANT
BIT DRILL 2 FAST STEP (BIT) ×2 IMPLANT
BIT DRILL 2.5X4 QC (BIT) ×2 IMPLANT
BLADE MINI RND TIP GREEN BEAV (BLADE) IMPLANT
BLADE SURG 15 STRL LF DISP TIS (BLADE) ×1 IMPLANT
BLADE SURG 15 STRL SS (BLADE) ×3
BLADE VORTEX 6.0 (BLADE) IMPLANT
BNDG CMPR 9X4 STRL LF SNTH (GAUZE/BANDAGES/DRESSINGS) ×1
BNDG COHESIVE 4X5 TAN STRL (GAUZE/BANDAGES/DRESSINGS) ×2 IMPLANT
BNDG ESMARK 4X9 LF (GAUZE/BANDAGES/DRESSINGS) ×3 IMPLANT
CLOSURE STERI-STRIP 1/2X4 (GAUZE/BANDAGES/DRESSINGS)
CLSR STERI-STRIP ANTIMIC 1/2X4 (GAUZE/BANDAGES/DRESSINGS) ×1 IMPLANT
COVER BACK TABLE 60X90IN (DRAPES) ×3 IMPLANT
CUFF TOURNIQUET SINGLE 18IN (TOURNIQUET CUFF) ×2 IMPLANT
DECANTER SPIKE VIAL GLASS SM (MISCELLANEOUS) ×1 IMPLANT
DRAPE EXTREMITY T 121X128X90 (DRAPE) ×3 IMPLANT
DRAPE INCISE IOBAN 66X45 STRL (DRAPES) ×3 IMPLANT
DRAPE OEC MINIVIEW 54X84 (DRAPES) ×3 IMPLANT
DRAPE SURG 17X23 STRL (DRAPES) ×3 IMPLANT
DRAPE U 20/CS (DRAPES) ×3 IMPLANT
DURAPREP 26ML APPLICATOR (WOUND CARE) ×3 IMPLANT
ELECT REM PT RETURN 9FT ADLT (ELECTROSURGICAL) ×3
ELECTRODE REM PT RTRN 9FT ADLT (ELECTROSURGICAL) ×1 IMPLANT
GAUZE SPONGE 4X4 12PLY STRL (GAUZE/BANDAGES/DRESSINGS) ×3 IMPLANT
GLOVE BIOGEL PI IND STRL 6.5 (GLOVE) IMPLANT
GLOVE BIOGEL PI IND STRL 8 (GLOVE) ×2 IMPLANT
GLOVE BIOGEL PI INDICATOR 6.5 (GLOVE) ×2
GLOVE BIOGEL PI INDICATOR 8 (GLOVE) ×4
GLOVE ECLIPSE 6.5 STRL STRAW (GLOVE) ×2 IMPLANT
GLOVE EXAM NITRILE EXT CUFF MD (GLOVE) ×2 IMPLANT
GLOVE ORTHO TXT STRL SZ7.5 (GLOVE) ×3 IMPLANT
GLOVE SURG ORTHO 8.0 STRL STRW (GLOVE) ×3 IMPLANT
GOWN STRL REUS W/ TWL LRG LVL3 (GOWN DISPOSABLE) ×1 IMPLANT
GOWN STRL REUS W/ TWL XL LVL3 (GOWN DISPOSABLE) ×2 IMPLANT
GOWN STRL REUS W/TWL LRG LVL3 (GOWN DISPOSABLE) ×3
GOWN STRL REUS W/TWL XL LVL3 (GOWN DISPOSABLE) ×6
K-WIRE .062X4 (WIRE) ×4 IMPLANT
NDL HYPO 25X1 1.5 SAFETY (NEEDLE) IMPLANT
NEEDLE HYPO 25X1 1.5 SAFETY (NEEDLE) IMPLANT
NS IRRIG 1000ML POUR BTL (IV SOLUTION) ×3 IMPLANT
PACK BASIN DAY SURGERY FS (CUSTOM PROCEDURE TRAY) ×3 IMPLANT
PAD CAST 3X4 CTTN HI CHSV (CAST SUPPLIES) IMPLANT
PAD CAST 4YDX4 CTTN HI CHSV (CAST SUPPLIES) IMPLANT
PADDING CAST ABS 4INX4YD NS (CAST SUPPLIES) ×2
PADDING CAST ABS COTTON 4X4 ST (CAST SUPPLIES) ×1 IMPLANT
PADDING CAST COTTON 3X4 STRL (CAST SUPPLIES) ×3
PADDING CAST COTTON 4X4 STRL (CAST SUPPLIES)
PEG SUBCHONDRAL SMOOTH 2.0X18 (Peg) ×4 IMPLANT
PEG SUBCHONDRAL SMOOTH 2.0X20 (Peg) ×10 IMPLANT
PENCIL BUTTON HOLSTER BLD 10FT (ELECTRODE) ×3 IMPLANT
PLATE SHORT 24.4X51.3 RT (Plate) ×2 IMPLANT
SCREW BN 12X3.5XNS CORT TI (Screw) IMPLANT
SCREW BN 13X3.5XNS CORT TI (Screw) IMPLANT
SCREW CORT 3.5X12 (Screw) ×3 IMPLANT
SCREW CORT 3.5X13 (Screw) ×3 IMPLANT
SLEEVE SCD COMPRESS KNEE MED (MISCELLANEOUS) ×3 IMPLANT
SPLINT PLASTER CAST XFAST 3X15 (CAST SUPPLIES) IMPLANT
SPLINT PLASTER XTRA FASTSET 3X (CAST SUPPLIES)
STOCKINETTE 4X48 STRL (DRAPES) ×3 IMPLANT
SUCTION FRAZIER TIP 10 FR DISP (SUCTIONS) ×3 IMPLANT
SUT ETHILON 3 0 PS 1 (SUTURE) IMPLANT
SUT ETHILON 4 0 PS 2 18 (SUTURE) IMPLANT
SUT MNCRL AB 4-0 PS2 18 (SUTURE) IMPLANT
SUT VIC AB 0 CT1 27 (SUTURE)
SUT VIC AB 0 CT1 27XBRD ANBCTR (SUTURE) IMPLANT
SUT VIC AB 2-0 SH 27 (SUTURE) ×3
SUT VIC AB 2-0 SH 27XBRD (SUTURE) IMPLANT
SUT VICRYL 3-0 CR8 SH (SUTURE) ×3 IMPLANT
SYR BULB 3OZ (MISCELLANEOUS) ×3 IMPLANT
SYR CONTROL 10ML LL (SYRINGE) IMPLANT
TOWEL OR 17X24 6PK STRL BLUE (TOWEL DISPOSABLE) ×3 IMPLANT
TUBE CONNECTING 20'X1/4 (TUBING) ×1
TUBE CONNECTING 20X1/4 (TUBING) ×2 IMPLANT
UNDERPAD 30X30 INCONTINENT (UNDERPADS AND DIAPERS) ×3 IMPLANT

## 2014-02-28 NOTE — Op Note (Addendum)
02/28/2014  5:59 PM  PATIENT:  Courtney Bowman    PRE-OPERATIVE DIAGNOSIS:  right distal radius fracture  POST-OPERATIVE DIAGNOSIS:  Same  PROCEDURE:  ORIF DISTAL RADIUS FRACTURE, 3 PIECES  SURGEON:  Johnny Bridge, MD  PHYSICIAN ASSISTANT: Joya Gaskins, OPA-C, present and scrubbed throughout the case, critical for completion in a timely fashion, and for retraction, instrumentation, and closure.  ANESTHESIA:   General  PREOPERATIVE INDICATIONS:  Courtney Bowman is a  44 y.o. female with a diagnosis of right distal radius fracture who elected for surgical management due to fracture displacement.  There was a substantial articular segment on the ulnar corner that was displaced and flipped about 45, with loss of height of the radial inclination of the styloid.  The risks benefits and alternatives were discussed with the patient preoperatively including but not limited to the risks of infection, bleeding, nerve injury, cardiopulmonary complications, the need for revision surgery, tendon rupture, hardware prominence, hardware failure, nonunion, malunion, post-traumatic arthritis, regional pain syndrome, among others, and the patient was willing to proceed.  OPERATIVE IMPLANTS: Biomet DVR volar plate with 2 proximal cortical screws and multiple distal interlocking smooth pegs, using the short narrow plate.   OPERATIVE FINDINGS: Comminution of the distal radius fracture, with significant displacement, more than 3 pieces.  OPERATIVE PROCEDURE: The patient was brought to the operating room and placed in the supine position. General anesthesia was administered. IV antibiotics were given. Time out was performed. The upper extremity was prepped and draped in usual sterile fashion. The arm was elevated and exsanguinated and the tourniquet was inflated at 232mm hg.    Volar approach to the distal radius was carried out, and the flexor carpi radialis was retracted radially. The radial artery was  protected throughout the case.  Deep dissection was carried down, and the pronator quadratus was elevated off of the radius. The fracture site was identified and cleaned and reduced anatomically. I had to keep the ulnar corner up with a elevator.  This keyed into place nicely. I reduced the radial shaft to the styloid using a crab claw.  I held this provisionally with a K wire, and C-arm used to confirm alignment.   I had restored height and inclination and then applied a volar plate. A K wire was used to confirm appropriate position of the plate, and once I was satisfied with the overall alignment I was able to secure the plate proximally with a cortical screw.   I then secured the fracture with multiple smooth interlocking pegs distally, and confirmed that none of these were in the joint, and none of these were penetrating the dorsal cortex. I also secured the plate proximally with one more cortical screw. On the ulnar side of the plate distally I used shorter pegs than on the radial side.    The wounds were irrigated copiously, and I repaired the pronator quadratus as well as possible with 2-0 Vicryl followed by 3-0 subcutaneous Vicryl for the skin and Steri-Strips and sterile gauze and a volar splint. The tourniquet was released. She was awakened and returned back in stable and satisfactory condition. There were no complications and She tolerated the procedure well.  After the case was completed I also used the mini C-arm to examine the left elbow because she was complaining of pain there. 3 views of left elbow did not demonstrate any evidence for fracture or dislocation.

## 2014-02-28 NOTE — Transfer of Care (Signed)
Immediate Anesthesia Transfer of Care Note  Patient: Courtney Bowman  Procedure(s) Performed: Procedure(s): OPEN REDUCTION INTERNAL FIXATION (ORIF) RIGHT DISTAL RADIAL FRACTURE (Right)  Patient Location: PACU  Anesthesia Type:GA combined with regional for post-op pain  Level of Consciousness: awake, alert , oriented and patient cooperative  Airway & Oxygen Therapy: Patient Spontanous Breathing and Patient connected to face mask oxygen  Post-op Assessment: Report given to PACU RN and Post -op Vital signs reviewed and stable  Post vital signs: Reviewed and stable  Complications: No apparent anesthesia complications

## 2014-02-28 NOTE — Anesthesia Preprocedure Evaluation (Addendum)
Anesthesia Evaluation  Patient identified by MRN, date of birth, ID band Patient awake    Reviewed: Allergy & Precautions, H&P , NPO status , Patient's Chart, lab work & pertinent test results  Airway Mallampati: III  TM Distance: >3 FB Neck ROM: Full    Dental  (+) Teeth Intact, Dental Advisory Given   Pulmonary neg pulmonary ROS,          Cardiovascular negative cardio ROS      Neuro/Psych negative neurological ROS  negative psych ROS   GI/Hepatic negative GI ROS, Neg liver ROS,   Endo/Other  negative endocrine ROS  Renal/GU      Musculoskeletal negative musculoskeletal ROS (+)   Abdominal   Peds  Hematology  (+) anemia , Hgb 7.0   Anesthesia Other Findings   Reproductive/Obstetrics                           Anesthesia Physical Anesthesia Plan  ASA: II  Anesthesia Plan: General and Regional   Post-op Pain Management:    Induction: Intravenous  Airway Management Planned: LMA  Additional Equipment:   Intra-op Plan:   Post-operative Plan: Extubation in OR  Informed Consent: I have reviewed the patients History and Physical, chart, labs and discussed the procedure including the risks, benefits and alternatives for the proposed anesthesia with the patient or authorized representative who has indicated his/her understanding and acceptance.   Dental advisory given  Plan Discussed with: CRNA and Surgeon  Anesthesia Plan Comments:       Anesthesia Quick Evaluation

## 2014-02-28 NOTE — Progress Notes (Signed)
Pt's neck/shoulder pain reported to Dr. Conrad Alva.

## 2014-02-28 NOTE — H&P (Signed)
PREOPERATIVE H&P  Chief Complaint: right distal radius fracture  HPI: Courtney Bowman is a 43 y.o. female who presents for preoperative history and physical with a diagnosis of right distal radius fracture. Symptoms are rated as moderate to severe, and have been worsening.  This is significantly impairing activities of daily living.  She has elected for surgical management. She had significant displacement intra-articularly at the dorsal ulnar corner, as well as shortening and flattening of the radial inclination. She elected for surgical intervention in order to reduce risks for posttraumatic arthritis.  Past Medical History  Diagnosis Date  . Blood type B-   . Anemia    Past Surgical History  Procedure Laterality Date  . Cesarean section      X2   History   Social History  . Marital Status: Single    Spouse Name: N/A    Number of Children: N/A  . Years of Education: N/A   Social History Main Topics  . Smoking status: Never Smoker   . Smokeless tobacco: Never Used  . Alcohol Use: Yes     Comment: social  . Drug Use: No  . Sexual Activity: Yes   Other Topics Concern  . None   Social History Narrative   Family History  Problem Relation Age of Onset  . Cancer Father     multiple myeloma   Allergies  Allergen Reactions  . Hydrocodone Nausea And Vomiting   Prior to Admission medications   Medication Sig Start Date End Date Taking? Authorizing Provider  Iron-Vit C-Vit B12-Folic Acid (IRON 005 PLUS PO) Take by mouth.   Yes Historical Provider, MD     Positive ROS: All other systems have been reviewed and were otherwise negative with the exception of those mentioned in the HPI and as above.  Physical Exam: General: Alert, no acute distress Cardiovascular: No pedal edema Respiratory: No cyanosis, no use of accessory musculature GI: No organomegaly, abdomen is soft and non-tender Skin: No lesions in the area of chief complaint Neurologic: Sensation intact  distally Psychiatric: Patient is competent for consent with normal mood and affect Lymphatic: No axillary or cervical lymphadenopathy  MUSCULOSKELETAL: Right hand has sensation intact throughout, good capillary refill, positive pain to palpation over the distal radius.  Assessment: right distal radius fracture  Plan: Plan for Procedure(s): OPEN REDUCTION INTERNAL FIXATION (ORIF) RIGHT DISTAL RADIAL FRACTURE  The risks benefits and alternatives were discussed with the patient including but not limited to the risks of nonoperative treatment, versus surgical intervention including infection, bleeding, nerve injury, malunion, nonunion, the need for revision surgery, hardware prominence, hardware failure, the need for hardware removal, blood clots, cardiopulmonary complications, morbidity, mortality, among others, and they were willing to proceed.     Johnny Bridge, MD Cell [phone]   02/28/2014 4:02 PM

## 2014-02-28 NOTE — Progress Notes (Signed)
Pt complaining of pain in upper right back, small size area, near neck area. No swelling in neck symmetrical. Repositioned. Pain 5/10 Intermit

## 2014-02-28 NOTE — Progress Notes (Signed)
Assisted Dr. Rob Fitzgerald with right, ultrasound guided, supraclavicular block. Side rails up, monitors on throughout procedure. See vital signs in flow sheet. Tolerated Procedure well. 

## 2014-02-28 NOTE — Anesthesia Procedure Notes (Addendum)
Anesthesia Regional Block:  Supraclavicular block  Pre-Anesthetic Checklist: ,, timeout performed, Correct Patient, Correct Site, Correct Laterality, Correct Procedure, Correct Position, site marked, Risks and benefits discussed,  Surgical consent,  Pre-op evaluation,  At surgeon's request and post-op pain management  Laterality: Right  Prep: chloraprep       Needles:  Injection technique: Single-shot  Needle Type: Echogenic Stimulator Needle     Needle Length: 9cm 9 cm Needle Gauge: 21 and 21 G    Additional Needles:  Procedures: ultrasound guided (picture in chart) and nerve stimulator Supraclavicular block  Nerve Stimulator or Paresthesia:  Response: biceps, 0.4 mA,   Additional Responses:   Narrative:  Start time: 02/28/2014 3:15 PM End time: 02/28/2014 3:25 PM Injection made incrementally with aspirations every 5 mL.  Performed by: Personally  Anesthesiologist: Suzette Battiest E  Additional Notes: Risks, benefits and alternative to block explained extensively.  Patient tolerated procedure well, without complications.   Procedure Name: LMA Insertion Date/Time: 02/28/2014 4:33 PM Performed by: Maryella Shivers Pre-anesthesia Checklist: Patient identified, Emergency Drugs available, Suction available and Patient being monitored Patient Re-evaluated:Patient Re-evaluated prior to inductionOxygen Delivery Method: Circle System Utilized Preoxygenation: Pre-oxygenation with 100% oxygen Intubation Type: IV induction Ventilation: Mask ventilation without difficulty LMA: LMA inserted LMA Size: 4.0 Number of attempts: 1 Airway Equipment and Method: bite block Placement Confirmation: positive ETCO2 Tube secured with: Tape Dental Injury: Teeth and Oropharynx as per pre-operative assessment

## 2014-02-28 NOTE — Progress Notes (Deleted)
Pt complaining of pain in upper right back, small size area, near neck area.  No swelling in neck symmetrical.  Repositioned.  Pain 5/10  Intermit

## 2014-02-28 NOTE — Progress Notes (Signed)
CRNA Valla Leaver here to get pt,  Pt was repositioned earlier with no change,  Inform Joe to please inform Dr Conrad [place] of pain which stated he would advise.  Pt taken to or

## 2014-02-28 NOTE — Progress Notes (Addendum)
Pt's mother upset when we told her and pt's daughter to help pt get dressed. She said the nurse's have always helped me dress. Patient's daughter said she was OK to help her mother dress- then pt's mother said her son needs to see her, I will go get her son so he can see her. Joellen Jersey and daughter dressed patient. Patient dramatic - when moving  she would start spitting up. Pt a little demanding in PACU and discharge - requesting she had to use the bathroom x 3 - couldn't even wait for her nausea medication to be given, or to do fluoro on left wrist, stated she had to go now. Extremely anxious.

## 2014-02-28 NOTE — Anesthesia Postprocedure Evaluation (Signed)
Anesthesia Post Note  Patient: Courtney Bowman  Procedure(s) Performed: Procedure(s) (LRB): OPEN REDUCTION INTERNAL FIXATION (ORIF) RIGHT DISTAL RADIAL FRACTURE (Right)  Anesthesia type: general  Patient location: PACU  Post pain: Pain level controlled  Post assessment: Patient's Cardiovascular Status Stable  Last Vitals:  Filed Vitals:   02/28/14 1815  BP: 154/88  Pulse: 103  Temp:   Resp: 19    Post vital signs: Reviewed and stable  Level of consciousness: sedated  Complications: No apparent anesthesia complications

## 2014-02-28 NOTE — Discharge Instructions (Signed)
Diet: As you were doing prior to hospitalization   Shower:  May shower but keep the wounds dry, use an occlusive plastic wrap, NO SOAKING IN TUB.  If the bandage gets wet, change with a clean dry gauze.  Dressing:  Keep splint dry.  There are sticky tapes (steri-strips) on your wounds and all the stitches are absorbable.  Leave the steri-strips in place when changing your dressings, they will peel off with time, usually 2-3 weeks.  Activity:  Increase activity slowly as tolerated, but follow the weight bearing instructions below.  No lifting or driving for 6 weeks.  Weight Bearing:   No lifting with right arm..    To prevent constipation: you may use a stool softener such as -  Colace (over the counter) 100 mg by mouth twice a day  Drink plenty of fluids (prune juice may be helpful) and high fiber foods Miralax (over the counter) for constipation as needed.    Itching:  If you experience itching with your medications, try taking only a single pain pill, or even half a pain pill at a time.  You may take up to 10 pain pills per day, and you can also use benadryl over the counter for itching or also to help with sleep.   Precautions:  If you experience chest pain or shortness of breath - call 911 immediately for transfer to the hospital emergency department!!  If you develop a fever greater that 101 F, purulent drainage from wound, increased redness or drainage from wound, or calf pain -- Call the office at [phone]                                                Follow- Up Appointment:  Please call for an appointment to be seen in 2 weeks [place] - [phone]  Regional Anesthesia Blocks  1. Numbness or the inability to move the "blocked" extremity may last from 3-48 hours after placement. The length of time depends on the medication injected and your individual response to the medication. If the numbness is not going away after 48 hours, call your surgeon.  2. The extremity that  is blocked will need to be protected until the numbness is gone and the  Strength has returned. Because you cannot feel it, you will need to take extra care to avoid injury. Because it may be weak, you may have difficulty moving it or using it. You may not know what position it is in without looking at it while the block is in effect.  3. For blocks in the legs and feet, returning to weight bearing and walking needs to be done carefully. You will need to wait until the numbness is entirely gone and the strength has returned. You should be able to move your leg and foot normally before you try and bear weight or walk. You will need someone to be with you when you first try to ensure you do not fall and possibly risk injury.  4. Bruising and tenderness at the needle site are common side effects and will resolve in a few days.  5. Persistent numbness or new problems with movement should be communicated to the surgeon or the [place] [phone]).     Post Anesthesia Home Care Instructions  Activity: Get plenty of rest for the  remainder of the day. A responsible adult should stay with you for 24 hours following the procedure.  For the next 24 hours, DO NOT: -Drive a car -Paediatric nurse -Drink alcoholic beverages -Take any medication unless instructed by your physician -Make any legal decisions or sign important papers.  Meals: Start with liquid foods such as gelatin or soup. Progress to regular foods as tolerated. Avoid greasy, spicy, heavy foods. If nausea and/or vomiting occur, drink only clear liquids until the nausea and/or vomiting subsides. Call your physician if vomiting continues.  Special Instructions/Symptoms: Your throat may feel dry or sore from the anesthesia or the breathing tube placed in your throat during surgery. If this causes discomfort, gargle with warm salt water. The discomfort should disappear within 24  hours.   Post Anesthesia Home Care Instructions  Activity: Get plenty of rest for the remainder of the day. A responsible adult should stay with you for 24 hours following the procedure.  For the next 24 hours, DO NOT: -Drive a car -Paediatric nurse -Drink alcoholic beverages -Take any medication unless instructed by your physician -Make any legal decisions or sign important papers.  Meals: Start with liquid foods such as gelatin or soup. Progress to regular foods as tolerated. Avoid greasy, spicy, heavy foods. If nausea and/or vomiting occur, drink only clear liquids until the nausea and/or vomiting subsides. Call your physician if vomiting continues.  Special Instructions/Symptoms: Your throat may feel dry or sore from the anesthesia or the breathing tube placed in your throat during surgery. If this causes discomfort, gargle with warm salt water. The discomfort should disappear within 24 hours.

## 2014-03-03 ENCOUNTER — Encounter (HOSPITAL_BASED_OUTPATIENT_CLINIC_OR_DEPARTMENT_OTHER): Payer: Self-pay | Admitting: Orthopedic Surgery

## 2014-05-19 ENCOUNTER — Telehealth: Payer: Self-pay | Admitting: *Deleted

## 2014-05-19 NOTE — Telephone Encounter (Signed)
Pt called c/o having 2 cycles in 1 month LMP:05/04/14 lasted 5 days and then another cycle started on 05/16/14 which was heavy with clots. Pt also c/o vaginal odor as well off and on. Pt transferred to front desk to schedule OV aware nancy young is off on mondays.

## 2014-05-22 ENCOUNTER — Ambulatory Visit: Payer: Self-pay | Admitting: Women's Health

## 2014-05-23 ENCOUNTER — Telehealth: Payer: Self-pay | Admitting: *Deleted

## 2014-05-23 NOTE — Telephone Encounter (Signed)
Pt called requesting to speak with you and refused to inform me with what the call phone is related to. I spoke with patient on 05/19/14 and informed her that an OV is needed for her symptoms, she was transferred to front to desk to schedule and canceled appointment and now rescheduled to 05/29/14 for what says vaginal infection. Pt was upset stating" I need to speak with her personally and not me" I did explain to patient that you are seeing patients and I don't mind sending you the message but I need more information about what the call is about rather than asking you to call her. Patient said she is no longer bleeding and no longer has vaginal odor, but still declined to inform me what the issue is. Her [phone], if you would like me to inform patient to keep scheduled appointment. Please advise

## 2014-05-23 NOTE — Telephone Encounter (Signed)
Telephone call, questions answered. Had one. 15 days apart first time to have periods less than 21 days, will keep menstrual record if cycle if less than 21 days from day one today 1 will call and schedule appointment. States occasionally will have unusual odor but not daily. Will watch. Denies discharge, urinary symptoms, abdominal pain.

## 2014-05-29 ENCOUNTER — Ambulatory Visit: Payer: Self-pay | Admitting: Women's Health

## 2014-06-09 ENCOUNTER — Other Ambulatory Visit: Payer: Self-pay

## 2014-06-09 DIAGNOSIS — Z1231 Encounter for screening mammogram for malignant neoplasm of breast: Secondary | ICD-10-CM

## 2014-07-17 ENCOUNTER — Other Ambulatory Visit: Payer: Self-pay

## 2014-07-17 ENCOUNTER — Ambulatory Visit
Admission: RE | Admit: 2014-07-17 | Discharge: 2014-07-17 | Disposition: A | Discharge status: Home / Self Care | Payer: 59 | Source: Ambulatory Visit

## 2014-07-17 DIAGNOSIS — Z1231 Encounter for screening mammogram for malignant neoplasm of breast: Secondary | ICD-10-CM

## 2014-08-20 ENCOUNTER — Telehealth: Payer: Self-pay | Admitting: *Deleted

## 2014-08-20 NOTE — Telephone Encounter (Signed)
Pt called c/o 2 cycles with 2 week time frame. 1ST. Lmp:08/04/14-08/09/14 and 2nd 08/18/14-08/23/14. Per nancy telephone encounter on 05/23/14 OV will be made transferred to front desk

## 2014-08-27 ENCOUNTER — Encounter: Payer: Self-pay | Admitting: Women's Health

## 2014-08-27 ENCOUNTER — Ambulatory Visit (INDEPENDENT_AMBULATORY_CARE_PROVIDER_SITE_OTHER): Payer: 59 | Admitting: Women's Health

## 2014-08-27 VITALS — BP 124/78

## 2014-08-27 DIAGNOSIS — N938 Other specified abnormal uterine and vaginal bleeding: Secondary | ICD-10-CM

## 2014-08-27 LAB — WET PREP FOR TRICH, YEAST, CLUE
TRICH WET PREP: NONE SEEN
YEAST WET PREP: NONE SEEN

## 2014-08-27 MED ORDER — METRONIDAZOLE 0.75 % VA GEL: VAGINAL | Status: DC

## 2014-08-27 MED ORDER — MEDROXYPROGESTERONE ACETATE 10 MG PO TABS: 10.0000 mg | ORAL_TABLET | Freq: Every day | ORAL | Status: DC

## 2014-08-27 NOTE — Progress Notes (Signed)
Patient ID: Courtney Bowman, female   DOB: February 11, 1971, 44 y.o.   MRN: [id number] Presents with complaint of irregular periods for 2 months. Had 2 cycles in  April, first cycle in May was May 9 - May 14, second cycle started May 23 and has continued. Prior to April monthly cycles, condoms, not sexually active, denies need for STD screen. Negative STD screen 01/2014. Denies vaginal discharge, abdominal pain, fever, occasional vaginal odor relates to prolonged bleeding.  Exam: Appears well. External genitalia within normal limits, speculum exam moderate amount of menses type blood with odor noted, wet prep positive for amines, clues, TNTC bacteria. Bimanual no CMT or adnexal tenderness.  Bacteria vaginosis DUB  Plan: Provera 10 for 10 days, sonohysterogram after bleeding stops with Dr. Phineas Real, instructed to schedule. MetroGel vaginal cream 1 applicator at bedtime 5, alcohol precautions reviewed. Instructed to call if no relief of odor or bleeding. TSH, prolactin, qualitative hCG.

## 2014-08-27 NOTE — Patient Instructions (Signed)
Bacterial Vaginosis Bacterial vaginosis is a vaginal infection that occurs when the normal balance of bacteria in the vagina is disrupted. It results from an overgrowth of certain bacteria. This is the most common vaginal infection in women of childbearing age. Treatment is important to prevent complications, especially in pregnant women, as it can cause a premature delivery. CAUSES  Bacterial vaginosis is caused by an increase in harmful bacteria that are normally present in smaller amounts in the vagina. Several different kinds of bacteria can cause bacterial vaginosis. However, the reason that the condition develops is not fully understood. RISK FACTORS Certain activities or behaviors can put you at an increased risk of developing bacterial vaginosis, including:  Having a new sex partner or multiple sex partners.  Douching.  Using an intrauterine device (IUD) for contraception. Women do not get bacterial vaginosis from toilet seats, bedding, swimming pools, or contact with objects around them. SIGNS AND SYMPTOMS  Some women with bacterial vaginosis have no signs or symptoms. Common symptoms include:  Grey vaginal discharge.  A fishlike odor with discharge, especially after sexual intercourse.  Itching or burning of the vagina and vulva.  Burning or pain with urination. DIAGNOSIS  Your health care provider will take a medical history and examine the vagina for signs of bacterial vaginosis. A sample of vaginal fluid may be taken. Your health care provider will look at this sample under a microscope to check for bacteria and abnormal cells. A vaginal pH test may also be done.  TREATMENT  Bacterial vaginosis may be treated with antibiotic medicines. These may be given in the form of a pill or a vaginal cream. A second round of antibiotics may be prescribed if the condition comes back after treatment.  HOME CARE INSTRUCTIONS   Only take over-the-counter or prescription medicines as  directed by your health care provider.  If antibiotic medicine was prescribed, take it as directed. Make sure you finish it even if you start to feel better.  Do not have sex until treatment is completed.  Tell all sexual partners that you have a vaginal infection. They should see their health care provider and be treated if they have problems, such as a mild rash or itching.  Practice safe sex by using condoms and only having one sex partner. SEEK MEDICAL CARE IF:   Your symptoms are not improving after 3 days of treatment.  You have increased discharge or pain.  You have a fever. MAKE SURE YOU:   Understand these instructions.  Will watch your condition.  Will get help right away if you are not doing well or get worse. FOR MORE INFORMATION  Centers for Disease Control and Prevention, Division of STD Prevention: AppraiserFraud.fi American Sexual Health Association (ASHA): www.ashastd.org  Document Released: 03/14/2005 Document Revised: 01/02/2013 Document Reviewed: 10/24/2012 [facility] Patient Information 2015 [place], [place]. This information is not intended to replace advice given to you by your health care provider. Make sure you discuss any questions you have with your health care provider. Dysfunctional Uterine Bleeding Normally, menstrual periods begin between ages 36 to 25 in young women. A normal menstrual cycle/period may begin every 23 days up to 35 days and lasts from 1 to 7 days. Around 12 to 14 days before your menstrual period starts, ovulation (ovary produces an egg) occurs. When counting the time between menstrual periods, count from the first day of bleeding of the previous period to the first day of bleeding of the next period. Dysfunctional (abnormal) uterine bleeding is bleeding  that is different from a normal menstrual period. Your periods may come earlier or later than usual. They may be lighter, have blood clots or be heavier. You may have bleeding between periods,  or you may skip one period or more. You may have bleeding after sexual intercourse, bleeding after menopause, or no menstrual period. CAUSES   Pregnancy (normal, miscarriage, tubal).  IUDs (intrauterine device, birth control).  Birth control pills.  Hormone treatment.  Menopause.  Infection of the cervix.  Blood clotting problems.  Infection of the inside lining of the uterus.  Endometriosis, inside lining of the uterus growing in the pelvis and other female organs.  Adhesions (scar tissue) inside the uterus.  Obesity or severe weight loss.  Uterine polyps inside the uterus.  Cancer of the vagina, cervix, or uterus.  Ovarian cysts or polycystic ovary syndrome.  Medical problems (diabetes, thyroid disease).  Uterine fibroids (noncancerous tumor).  Problems with your female hormones.  Endometrial hyperplasia, very thick lining and enlarged cells inside of the uterus.  Medicines that interfere with ovulation.  Radiation to the pelvis or abdomen.  Chemotherapy. DIAGNOSIS   Your doctor will discuss the history of your menstrual periods, medicines you are taking, changes in your weight, stress in your life, and any medical problems you may have.  Your doctor will do a physical and pelvic examination.  Your doctor may want to perform certain tests to make a diagnosis, such as:  Pap test.  Blood tests.  Cultures for infection.  CT scan.  Ultrasound.  Hysteroscopy.  Laparoscopy.  MRI.  Hysterosalpingography.  D and C.  Endometrial biopsy. TREATMENT  Treatment will depend on the cause of the dysfunctional uterine bleeding (DUB). Treatment may include:  Observing your menstrual periods for a couple of months.  Prescribing medicines for medical problems, including:  Antibiotics.  Hormones.  Birth control pills.  Removing an IUD (intrauterine device, birth control).  Surgery:  D and C (scrape and remove tissue from inside the  uterus).  Laparoscopy (examine inside the abdomen with a lighted tube).  Uterine ablation (destroy lining of the uterus with electrical current, laser, heat, or freezing).  Hysteroscopy (examine cervix and uterus with a lighted tube).  Hysterectomy (remove the uterus). HOME CARE INSTRUCTIONS   If medicines were prescribed, take exactly as directed. Do not change or switch medicines without consulting your caregiver.  Long term heavy bleeding may result in iron deficiency. Your caregiver may have prescribed iron pills. They help replace the iron that your body lost from heavy bleeding. Take exactly as directed.  Do not take aspirin or medicines that contain aspirin one week before or during your menstrual period. Aspirin may make the bleeding worse.  If you need to change your sanitary pad or tampon more than once every 2 hours, stay in bed with your feet elevated and a cold pack on your lower abdomen. Rest as much as possible, until the bleeding stops or slows down.  Eat well-balanced meals. Eat foods high in iron. Examples are:  Leafy green vegetables.  Whole-grain breads and cereals.  Eggs.  Meat.  Liver.  Do not try to lose weight until the abnormal bleeding has stopped and your blood iron level is back to normal. Do not lift more than ten pounds or do strenuous activities when you are bleeding.  For a couple of months, make note on your calendar, marking the start and ending of your period, and the type of bleeding (light, medium, heavy, spotting, clots or missed  periods). This is for your caregiver to better evaluate your problem. SEEK MEDICAL CARE IF:   You develop nausea (feeling sick to your stomach) and vomiting, dizziness, or diarrhea while you are taking your medicine.  You are getting lightheaded or weak.  You have any problems that may be related to the medicine you are taking.  You develop pain with your DUB.  You want to remove your IUD.  You want to stop  or change your birth control pills or hormones.  You have any type of abnormal bleeding mentioned above.  You are over 72 years old and have not had a menstrual period yet.  You are 44 years old and you are still having menstrual periods.  You have any of the symptoms mentioned above.  You develop a rash. SEEK IMMEDIATE MEDICAL CARE IF:   An oral temperature above 102 F (38.9 C) develops.  You develop chills.  You are changing your sanitary pad or tampon more than once an hour.  You develop abdominal pain.  You pass out or faint. Document Released: 03/11/2000 Document Revised: 06/06/2011 Document Reviewed: 02/10/2009 [facility] Patient Information 2015 [place], [place]. This information is not intended to replace advice given to you by your health care provider. Make sure you discuss any questions you have with your health care provider.

## 2014-08-28 ENCOUNTER — Other Ambulatory Visit: Payer: Self-pay | Admitting: Gynecology

## 2014-08-28 DIAGNOSIS — N939 Abnormal uterine and vaginal bleeding, unspecified: Secondary | ICD-10-CM

## 2014-08-28 DIAGNOSIS — N938 Other specified abnormal uterine and vaginal bleeding: Secondary | ICD-10-CM

## 2014-08-28 LAB — TSH: TSH: 0.752 u[IU]/mL (ref 0.350–4.500)

## 2014-08-28 LAB — HCG, SERUM, QUALITATIVE: PREG SERUM: NEGATIVE

## 2014-08-28 LAB — PROLACTIN: PROLACTIN: 12.2 ng/mL

## 2014-09-01 ENCOUNTER — Telehealth: Payer: Self-pay | Admitting: Gynecology

## 2014-09-01 NOTE — Telephone Encounter (Signed)
09/01/14-I spoke w/pt today to let her know that her [facility] puts the entire cost of the procedure(875.76) and possible biopsy(269.62) towards her $4,000 deductible of which only $296.25 has been met. She has an HRA account so no money is to be collected upfront. Per [employer] she has $1,150.94 in that account as of today. They will pay [place] directly from these funds.wl

## 2014-09-03 ENCOUNTER — Ambulatory Visit (INDEPENDENT_AMBULATORY_CARE_PROVIDER_SITE_OTHER): Payer: 59

## 2014-09-03 ENCOUNTER — Encounter: Payer: Self-pay | Admitting: Gynecology

## 2014-09-03 ENCOUNTER — Other Ambulatory Visit: Payer: Self-pay | Admitting: Gynecology

## 2014-09-03 ENCOUNTER — Ambulatory Visit (INDEPENDENT_AMBULATORY_CARE_PROVIDER_SITE_OTHER): Payer: 59 | Admitting: Gynecology

## 2014-09-03 DIAGNOSIS — N939 Abnormal uterine and vaginal bleeding, unspecified: Secondary | ICD-10-CM

## 2014-09-03 DIAGNOSIS — D250 Submucous leiomyoma of uterus: Secondary | ICD-10-CM

## 2014-09-03 DIAGNOSIS — N9489 Other specified conditions associated with female genital organs and menstrual cycle: Secondary | ICD-10-CM

## 2014-09-03 DIAGNOSIS — N852 Hypertrophy of uterus: Secondary | ICD-10-CM | POA: Diagnosis not present

## 2014-09-03 DIAGNOSIS — N938 Other specified abnormal uterine and vaginal bleeding: Secondary | ICD-10-CM

## 2014-09-03 DIAGNOSIS — N831 Corpus luteum cyst of ovary, unspecified side: Secondary | ICD-10-CM

## 2014-09-03 DIAGNOSIS — D251 Intramural leiomyoma of uterus: Secondary | ICD-10-CM

## 2014-09-03 DIAGNOSIS — N926 Irregular menstruation, unspecified: Secondary | ICD-10-CM

## 2014-09-03 NOTE — Patient Instructions (Signed)
Office will call you with biopsy results and to arrange the D&C.

## 2014-09-03 NOTE — Progress Notes (Signed)
GLADYES KUDO 1970/06/06 [id number]        44 y.o.  [id number] presents for sonohysterogram having recently seen [facility] complaining of irregular menses over the past 2 months. Previously was having regular monthly menses but then started to bleed on and off having 2 menses in May and several bleeding episodes in April. HCG negative as well as prolactin and TSH.  Past medical history,surgical history, problem list, medications, allergies, family history and social history were all reviewed and documented in the EPIC chart.  Directed ROS with pertinent positives and negatives documented in the history of present illness/assessment and plan.  Exam: [place] assistant There were no vitals filed for this visit. General appearance:  Normal Pelvic: External BUS vagina normal. Cervix normal. Uterus retroverted Gen.'s in size midline mobile nontender. Adnexa without masses or tenderness.  Ultrasound shows uterus generous in size with multiple small myomas measuring 28 mm, 21 mm, 20 mm. Endometrial echo 10.3 mm. Right ovary with follicular changes. Left ovary with thick walled cyst consistent with corpus luteum 25 x 19 x 25 mm. Cul-de-sac negative.  Sonohysterogram performed, sterile technique, easy catheter introduction, good distention with 16 x 9 x 9 mm posterior wall defect consistent with probable submucous myoma versus polyp.  Endometrial sample taken. Patient tolerated well  Assessment/Plan:  44 y.o. 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 Patient will follow up for biopsy results. Recommended we proceed with hysteroscopic resection of endometrial defect. I reviewed with involved with this to include the D&C portion as well as the resectoscopic portion. Expected intraoperative and postoperative courses reviewed. Risks to include infection, hemorrhage necessitating  transfusion, uterine perforation, damage to surrounding structures to include bowel bladder ureters vessels nerves necessitating major splintered reparative surgeries and distended media absorption complications reviewed. No guarantees as far as irregular bleeding control or final pathology. If patient elects not to proceed that she accepts the risk that this polyp/myoma is not benign. Patient will follow up for the biopsy results and schedule her hysteroscopy D&C.    Anastasio Auerbach MD, 9:53 AM 09/03/2014

## 2014-09-05 ENCOUNTER — Other Ambulatory Visit: Payer: Self-pay | Admitting: Internal Medicine

## 2014-09-12 ENCOUNTER — Telehealth: Payer: Self-pay

## 2014-09-12 NOTE — Telephone Encounter (Signed)
Patient returned my call regarding scheduling surgery.  We discussed her insurance benefits and her estimated financial responsibility.  She has a rather large deductible. She asked about waiting until first of the year to have this surgery when she will have some money in her [employer] Savings acct to help her pay her bills.  She wanted to me to check with you and see would there be a problem with her postponing until January.  Also, she said [place] had prescribed Megace for her bleeding and she asked if you could keep prescribing that for her until January.  She knows Dr. Loetta Rough is off today and I will call her next week.

## 2014-09-15 ENCOUNTER — Other Ambulatory Visit: Payer: Self-pay | Admitting: Gynecology

## 2014-09-15 MED ORDER — NORETHIN ACE-ETH ESTRAD-FE 1-20 MG-MCG PO TABS: 1.0000 | ORAL_TABLET | Freq: Every day | ORAL | Status: DC

## 2014-09-15 NOTE — Telephone Encounter (Signed)
June 1/20 okay. She does need an annual exam in November. A breast exam and routine lab work was not done.  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

## 2014-09-15 NOTE — Telephone Encounter (Signed)
There are 2 issues with this. The first is I cannot guarantee the pathology of the mass within the uterine cavity. More than likely it is certainly benign but postponing surgery for 6 months or so Courtney Bowman would have to accept the possibility that it was precancerous or early cancer. The second issue is I don't think it would be good to stay on progesterone daily for 6 months. One option would be a low dose oral contraceptive to see if that would not override the system. Courtney Bowman never smoked and is not being followed for any medical issues like hypertension. Courtney Bowman would have to accept the slight increased risk of blood clots like stroke heart attack DVT associated with birth control pills

## 2014-09-15 NOTE — Telephone Encounter (Signed)
Patient advised. Rx sent. 

## 2014-09-15 NOTE — Telephone Encounter (Signed)
I read patient Dr. Dorette Grate paragraph below and we discussed. Patient understands and accepts that Dr. Loetta Rough cannot guarantee her that mass is benign or that it is okay to wait until January.  Patient opts to wait and would like Rx for oc's. Previously on Junel 1/20 FE.  Also, patient is due for CE in November but stated she would not need it since she has had everything done, ie. Pelvic exams and SHGM.  I reminded her about breast exam and  Possible pap. She said [place] did breast exam at 08/27/14 visit. I explained that there was not documentation that [place] did breast exam.  She said she had "all the blood work done at that visit too."  She had a qual PT.  Patient insisted she would not need CE. I told her I would check with you.

## 2014-09-18 ENCOUNTER — Telehealth: Payer: Self-pay

## 2014-09-18 NOTE — Telephone Encounter (Signed)
Patient called stating she is ready to schedule surgery. Last we spoke she was planning to wait until January. I called her and left message in her voice mail to call me.

## 2014-10-02 ENCOUNTER — Other Ambulatory Visit: Payer: Self-pay | Admitting: Gynecology

## 2014-10-02 MED ORDER — NORETHIN ACE-ETH ESTRAD-FE 1-20 MG-MCG PO TABS: 1.0000 | ORAL_TABLET | Freq: Every day | ORAL | Status: DC

## 2014-10-21 ENCOUNTER — Telehealth: Payer: Self-pay

## 2014-10-21 ENCOUNTER — Other Ambulatory Visit: Payer: Self-pay | Admitting: Gynecology

## 2014-10-21 MED ORDER — MISOPROSTOL 200 MCG PO TABS: ORAL_TABLET | ORAL | Status: DC

## 2014-10-21 NOTE — Telephone Encounter (Signed)
I called patient and confirmed with her that her surgery is set for 12/16/14 at 7:30am.  I transferred her to appt desk to schedule pre op cons. I advised her she will need Cytotec tab vaginally hs before surgery. Rx sent.

## 2014-10-22 ENCOUNTER — Encounter: Payer: Self-pay | Admitting: Gynecology

## 2014-11-07 ENCOUNTER — Telehealth: Payer: Self-pay

## 2014-11-07 NOTE — Telephone Encounter (Signed)
She will need a preop appointment but I'm okay with no charge visit for this. Just make sure she reminds me about this when she comes.

## 2014-11-07 NOTE — Telephone Encounter (Signed)
Left detailed message on voice mail per [facility] access note.

## 2014-11-07 NOTE — Telephone Encounter (Signed)
Patient called inquiring if pre op consult was included in her surgery charge. I explained that is a separate charge however, post operative visit is included.    She said you are going "to have to opt out of that".  She said this is too expensive already and you have already talked to her about it. She suggested you could do this over the phone and I explained to her that is not appropriate.    I told her you had indicated on your order sheet that you wanted just a short consult with her and I would check with you and see if that was something you felt you could forego.

## 2014-11-26 ENCOUNTER — Telehealth: Payer: Self-pay

## 2014-11-26 NOTE — Telephone Encounter (Signed)
Patient called stating she was coming to make part of her surgery prepymt and asked who to pay it to and what to tell them its for. I told her anyone at the front desk can receive her payment and just let them know that it is toward her surgery preypmt. I reminded her the full payment we agreed on will be due before surgery and she can pay the remaining bal at pre-op consult.

## 2014-12-08 ENCOUNTER — Encounter (HOSPITAL_COMMUNITY): Payer: Self-pay | Admitting: *Deleted

## 2014-12-11 ENCOUNTER — Ambulatory Visit (INDEPENDENT_AMBULATORY_CARE_PROVIDER_SITE_OTHER): Payer: 59 | Admitting: Gynecology

## 2014-12-11 ENCOUNTER — Encounter: Payer: Self-pay | Admitting: Gynecology

## 2014-12-11 VITALS — BP 134/86

## 2014-12-11 DIAGNOSIS — N840 Polyp of corpus uteri: Secondary | ICD-10-CM

## 2014-12-11 DIAGNOSIS — N926 Irregular menstruation, unspecified: Secondary | ICD-10-CM

## 2014-12-11 DIAGNOSIS — N764 Abscess of vulva: Secondary | ICD-10-CM

## 2014-12-11 NOTE — Patient Instructions (Signed)
Apply warm soaks to the area on your bottom.  Follow up for surgery as scheduled.

## 2014-12-11 NOTE — Progress Notes (Signed)
Courtney Bowman 22-Jul-1970 [id number]   Preoperative consult  Chief complaint: irregular bleeding, endometrial polyp, anemia  History of present illness: 44 y.o. [id number] presents with history of irregular menses over the past 4-5 months. Regular menses before this. Currently on oral contraceptives with still having breakthrough bleeding. Sonohysterogram 08/2014 showed multiple small myomas with a 16 x 9 x 9 mm posterior wall defect consistent with either submucous myoma versus polyp. Hemoglobin noted to be low in the past in the 8 range.  Patient also notes the last several days a developing swelling with tenderness in her right perineal region. No history of same before.  Past medical history,surgical history, medications, allergies, family history and social history were all reviewed and documented in the EPIC chart.  ROS:  Was performed and pertinent positives and negatives are included in the history of present illness.  Exam:  [name] General: well developed, well nourished female, no acute distress HEENT: normal  Lungs: clear to auscultation without wheezing, rales or rhonchi  Cardiac: regular rate without rubs, murmurs or gallops  Abdomen: soft, nontender without masses, guarding, rebound, organomegaly  Pelvic: external bus vagina: normal  Excepting 1-2 cm boil left thigh crease with groin. Pointing and tender. Cervix: grossly normal  Uterus: normal size, midline and mobile, nontender  Adnexa: without masses or tenderness   Procedure: Skin overlying the boil was cleansed with Betadine and the boil was lanced with an 18-gauge needle and drained of pus completely. Instructions for sitz baths given.  Assessment/Plan:  44 y.o. [id number] with history of heavy irregular menses. On oral contraceptives. Known to be anemic in the past in the 8 range. Sonohysterogram shows intracavitary defect consistent with submucous myoma possible polyp. Patient admitted for hysteroscopy D&C with  resection of this area. Patient is going to stop her oral contraceptives preoperatively and restart them postoperatively. The need to use backup contraception stressed.  I reviewed with involved with the hysteroscopy D&C to include instrumentation, use of the hysteroscope and resectoscope as well as a D&C portion of the procedure.  The expected intraoperative and postoperative courses as well as the recovery period was reviewed. The risks of infection, prolonged antibiotics, reoperation for abscess or hematoma formation was discussed. The risks of hemorrhage necessitating transfusion and the risks of transfusion reaction, hepatitis, HIV, mad cow disease and other unknown entities was also discussed. Incisional complications to include opening and draining of incisions and closure by secondary intention, dehiscence and long-term issues of keloid/cosmetics and hernia formation were reviewed. The risk of inadvertent injury to internal organs including bowel, bladder, ureters, vessels, nerves either immediately recognized or delay recognized necessitating major exploratory reparative surgeries and future reparative surgeries including bowel resection, ostomy formation, bladder repair, ureteral damage repair was discussed with her. Patient understands her no guarantees as far as heavy bleeding or irregular bleeding relief and that her peers may continue heavy or irregular following the procedure.  The patient's questions were answered to her satisfaction and she is ready to proceed with surgery.      Anastasio Auerbach MD, 4:32 PM 12/11/2014

## 2014-12-12 ENCOUNTER — Other Ambulatory Visit: Payer: 59

## 2014-12-12 ENCOUNTER — Telehealth: Payer: Self-pay | Admitting: *Deleted

## 2014-12-12 DIAGNOSIS — D582 Other hemoglobinopathies: Secondary | ICD-10-CM

## 2014-12-12 LAB — CBC WITH DIFFERENTIAL/PLATELET
Basophils Absolute: 0.1 10*3/uL (ref 0.0–0.1)
Basophils Relative: 1 % (ref 0–1)
Eosinophils Absolute: 0.2 10*3/uL (ref 0.0–0.7)
Eosinophils Relative: 2 % (ref 0–5)
HEMATOCRIT: 31 % — AB (ref 36.0–46.0)
HEMOGLOBIN: 9.4 g/dL — AB (ref 12.0–15.0)
LYMPHS ABS: 2.6 10*3/uL (ref 0.7–4.0)
LYMPHS PCT: 26 % (ref 12–46)
MCH: 21 pg — AB (ref 26.0–34.0)
MCHC: 30.3 g/dL (ref 30.0–36.0)
MCV: 69.4 fL — AB (ref 78.0–100.0)
MONO ABS: 0.6 10*3/uL (ref 0.1–1.0)
MONOS PCT: 6 % (ref 3–12)
MPV: 9.2 fL (ref 8.6–12.4)
NEUTROS ABS: 6.6 10*3/uL (ref 1.7–7.7)
Neutrophils Relative %: 65 % (ref 43–77)
Platelets: 402 10*3/uL — ABNORMAL HIGH (ref 150–400)
RBC: 4.47 MIL/uL (ref 3.87–5.11)
RDW: 18.3 % — ABNORMAL HIGH (ref 11.5–15.5)
WBC: 10.1 10*3/uL (ref 4.0–10.5)

## 2014-12-12 NOTE — Telephone Encounter (Signed)
Per Dr.Fontaine verbal message ask patient if she could come by today to have CBC drawn, I spoke with patient and she will come at 2:30pm, order placed. Pt states she has been taking iron tablet every other day and not daily.

## 2014-12-12 NOTE — Telephone Encounter (Signed)
-----   Message from Anastasio Auerbach, MD sent at 12/11/2014  4:38 PM EDT ----- Call patient and ask her whether she's been taking extra iron. I just realized to her iron level was really low previously. If it remains low going into surgery it may slightly increase her risk of transfusion.

## 2014-12-15 ENCOUNTER — Telehealth: Payer: Self-pay | Admitting: *Deleted

## 2014-12-15 MED ORDER — DEXTROSE 5 % IV SOLN
2.0000 g | INTRAVENOUS | Status: AC
  Administered 2014-12-16: 2 g via INTRAVENOUS
  Filled 2014-12-15: qty 2

## 2014-12-15 NOTE — H&P (Signed)
  Courtney Bowman 1971-01-18 [id number]   History and Physical  Chief complaint: irregular bleeding, endometrial polyp, anemia  History of present illness: 44 y.o. Courtney Bowman presents with history of irregular menses over the past 4-5 months. Regular menses before this. Currently on oral contraceptives with still having breakthrough bleeding. Sonohysterogram 08/2014 showed multiple small myomas with a 16 x 9 x 9 mm posterior wall defect consistent with either submucous myoma versus polyp. Hemoglobin noted to be low in the past in the 8 range.  Patient also notes the last several days a developing swelling with tenderness in her right perineal region. No history of same before.  Past medical history,surgical history, medications, allergies, family history and social history were all reviewed and documented in the EPIC chart.  ROS: Was performed and pertinent positives and negatives are included in the history of present illness.  Exam: [name] 12/11/2014 General: well developed, well nourished female, no acute distress HEENT: normal  Lungs: clear to auscultation without wheezing, rales or rhonchi  Cardiac: regular rate without rubs, murmurs or gallops  Abdomen: soft, nontender without masses, guarding, rebound, organomegaly  Pelvic: external bus vagina: normal Excepting 1-2 cm boil left thigh crease with groin. Pointing and tender. Cervix: grossly normal  Uterus: normal size, midline and mobile, nontender  Adnexa: without masses or tenderness   Procedure: Skin overlying the boil was cleansed with Betadine and the boil was lanced with an 18-gauge needle and drained of pus completely. Instructions for sitz baths given.  Assessment/Plan: 44 y.o. [name] with history of heavy irregular menses. On oral contraceptives. Known to be anemic in the past in the 8 range. Sonohysterogram shows intracavitary defect consistent with submucous myoma possible polyp. Patient admitted for  hysteroscopy D&C with resection of this area. Patient is going to stop her oral contraceptives preoperatively and restart them postoperatively. The need to use backup contraception stressed. I reviewed with involved with the hysteroscopy D&C to include instrumentation, use of the hysteroscope and resectoscope as well as a D&C portion of the procedure. The expected intraoperative and postoperative courses as well as the recovery period was reviewed. The risks of infection, prolonged antibiotics, reoperation for abscess or hematoma formation was discussed. The risks of hemorrhage necessitating transfusion and the risks of transfusion reaction, hepatitis, HIV, mad cow disease and other unknown entities was also discussed. Incisional complications to include opening and draining of incisions and closure by secondary intention, dehiscence and long-term issues of keloid/cosmetics and hernia formation were reviewed. The risk of inadvertent injury to internal organs including bowel, bladder, ureters, vessels, nerves either immediately recognized or delay recognized necessitating major exploratory reparative surgeries and future reparative surgeries including bowel resection, ostomy formation, bladder repair, ureteral damage repair was discussed with her. Patient understands her no guarantees as far as heavy bleeding or irregular bleeding relief and that her peers may continue heavy or irregular following the procedure. The patient's questions were answered to her satisfaction and she is ready to proceed with surgery.   Anastasio Auerbach MD, 3:32 PM 12/15/2014

## 2014-12-15 NOTE — Telephone Encounter (Signed)
Patient called to get CBC results hemoglobin at 9.4 now, scheduled for D &C tomorrow. Pt wanted to confirm okay to proceed with surgery? Please advise

## 2014-12-15 NOTE — Telephone Encounter (Signed)
Left the below on voicemail  

## 2014-12-15 NOTE — Telephone Encounter (Signed)
At 9 I think it's okay.

## 2014-12-16 ENCOUNTER — Ambulatory Visit (HOSPITAL_COMMUNITY): Payer: 59 | Admitting: Anesthesiology

## 2014-12-16 ENCOUNTER — Ambulatory Visit (HOSPITAL_COMMUNITY)
Admission: RE | Admit: 2014-12-16 | Discharge: 2014-12-16 | Disposition: A | Discharge status: Home / Self Care | Payer: 59 | Source: Ambulatory Visit | Attending: Gynecology | Admitting: Gynecology

## 2014-12-16 ENCOUNTER — Encounter (HOSPITAL_COMMUNITY): Payer: Self-pay

## 2014-12-16 ENCOUNTER — Encounter (HOSPITAL_COMMUNITY)
Admission: RE | Disposition: A | Discharge status: Home / Self Care | Payer: Self-pay | Source: Ambulatory Visit | Attending: Gynecology

## 2014-12-16 DIAGNOSIS — D649 Anemia, unspecified: Secondary | ICD-10-CM

## 2014-12-16 DIAGNOSIS — N840 Polyp of corpus uteri: Secondary | ICD-10-CM | POA: Insufficient documentation

## 2014-12-16 DIAGNOSIS — N939 Abnormal uterine and vaginal bleeding, unspecified: Secondary | ICD-10-CM | POA: Insufficient documentation

## 2014-12-16 DIAGNOSIS — N926 Irregular menstruation, unspecified: Secondary | ICD-10-CM | POA: Insufficient documentation

## 2014-12-16 DIAGNOSIS — D250 Submucous leiomyoma of uterus: Secondary | ICD-10-CM | POA: Diagnosis not present

## 2014-12-16 HISTORY — PX: DILATATION & CURETTAGE/HYSTEROSCOPY WITH MYOSURE: SHX6511

## 2014-12-16 LAB — CBC
HCT: 35 % — ABNORMAL LOW (ref 36.0–46.0)
Hemoglobin: 10.3 g/dL — ABNORMAL LOW (ref 12.0–15.0)
MCH: 21.1 pg — AB (ref 26.0–34.0)
MCHC: 29.4 g/dL — AB (ref 30.0–36.0)
MCV: 71.6 fL — ABNORMAL LOW (ref 78.0–100.0)
PLATELETS: 374 10*3/uL (ref 150–400)
RBC: 4.89 MIL/uL (ref 3.87–5.11)
RDW: 18 % — AB (ref 11.5–15.5)
WBC: 10.1 10*3/uL (ref 4.0–10.5)

## 2014-12-16 LAB — COMPREHENSIVE METABOLIC PANEL
ALT: 15 U/L (ref 14–54)
ANION GAP: 9 (ref 5–15)
AST: 15 U/L (ref 15–41)
Albumin: 3.9 g/dL (ref 3.5–5.0)
Alkaline Phosphatase: 72 U/L (ref 38–126)
BUN: 12 mg/dL (ref 6–20)
CHLORIDE: 104 mmol/L (ref 101–111)
CO2: 24 mmol/L (ref 22–32)
Calcium: 9.4 mg/dL (ref 8.9–10.3)
Creatinine, Ser: 0.64 mg/dL (ref 0.44–1.00)
GFR calc non Af Amer: >60 mL/min (ref >60)
Glucose, Bld: 103 mg/dL — ABNORMAL HIGH (ref 65–99)
POTASSIUM: 3.8 mmol/L (ref 3.5–5.1)
SODIUM: 137 mmol/L (ref 135–145)
Total Bilirubin: 0.9 mg/dL (ref 0.3–1.2)
Total Protein: 7.5 g/dL (ref 6.5–8.1)

## 2014-12-16 LAB — TYPE AND SCREEN
ABO/RH(D): B NEG
ANTIBODY SCREEN: NEGATIVE

## 2014-12-16 LAB — ABO/RH: ABO/RH(D): B NEG

## 2014-12-16 LAB — HCG, SERUM, QUALITATIVE: PREG SERUM: NEGATIVE

## 2014-12-16 SURGERY — DILATATION & CURETTAGE/HYSTEROSCOPY WITH MYOSURE
Anesthesia: General

## 2014-12-16 MED ORDER — FENTANYL CITRATE (PF) 100 MCG/2ML IJ SOLN
INTRAMUSCULAR | Status: DC | PRN
  Administered 2014-12-16: 25 ug via INTRAVENOUS
  Administered 2014-12-16: 75 ug via INTRAVENOUS

## 2014-12-16 MED ORDER — FENTANYL CITRATE (PF) 100 MCG/2ML IJ SOLN: 25.0000 ug | INTRAMUSCULAR | Status: DC | PRN

## 2014-12-16 MED ORDER — PROPOFOL 10 MG/ML IV BOLUS
INTRAVENOUS | Status: DC | PRN
  Administered 2014-12-16: 160 mg via INTRAVENOUS

## 2014-12-16 MED ORDER — MIDAZOLAM HCL 2 MG/2ML IJ SOLN
INTRAMUSCULAR | Status: AC
  Filled 2014-12-16: qty 4

## 2014-12-16 MED ORDER — ONDANSETRON HCL 4 MG/2ML IJ SOLN
INTRAMUSCULAR | Status: AC
  Filled 2014-12-16: qty 2

## 2014-12-16 MED ORDER — LIDOCAINE HCL (CARDIAC) 20 MG/ML IV SOLN
INTRAVENOUS | Status: AC
  Filled 2014-12-16: qty 5

## 2014-12-16 MED ORDER — SODIUM CHLORIDE 0.9 % IR SOLN
Status: DC | PRN
  Administered 2014-12-16: 3000 mL

## 2014-12-16 MED ORDER — PHENYLEPHRINE 40 MCG/ML (10ML) SYRINGE FOR IV PUSH (FOR BLOOD PRESSURE SUPPORT)
PREFILLED_SYRINGE | INTRAVENOUS | Status: AC
  Filled 2014-12-16: qty 10

## 2014-12-16 MED ORDER — ONDANSETRON HCL 4 MG/2ML IJ SOLN
INTRAMUSCULAR | Status: DC | PRN
  Administered 2014-12-16: 4 mg via INTRAVENOUS

## 2014-12-16 MED ORDER — LIDOCAINE HCL 1 % IJ SOLN
INTRAMUSCULAR | Status: DC | PRN
  Administered 2014-12-16: 10 mL

## 2014-12-16 MED ORDER — LACTATED RINGERS IV SOLN
INTRAVENOUS | Status: DC
  Administered 2014-12-16 (×2): via INTRAVENOUS

## 2014-12-16 MED ORDER — ONDANSETRON HCL 4 MG/2ML IJ SOLN: 4.0000 mg | Freq: Once | INTRAMUSCULAR | Status: DC | PRN

## 2014-12-16 MED ORDER — PROPOFOL 10 MG/ML IV BOLUS
INTRAVENOUS | Status: AC
  Filled 2014-12-16: qty 20

## 2014-12-16 MED ORDER — OXYCODONE-ACETAMINOPHEN 5-325 MG PO TABS: 1.0000 | ORAL_TABLET | ORAL | Status: DC | PRN

## 2014-12-16 MED ORDER — KETOROLAC TROMETHAMINE 30 MG/ML IJ SOLN
INTRAMUSCULAR | Status: AC
  Filled 2014-12-16: qty 1

## 2014-12-16 MED ORDER — LIDOCAINE HCL 1 % IJ SOLN
INTRAMUSCULAR | Status: AC
  Filled 2014-12-16: qty 20

## 2014-12-16 MED ORDER — FENTANYL CITRATE (PF) 250 MCG/5ML IJ SOLN
INTRAMUSCULAR | Status: AC
  Filled 2014-12-16: qty 25

## 2014-12-16 MED ORDER — PHENYLEPHRINE HCL 10 MG/ML IJ SOLN
INTRAMUSCULAR | Status: DC | PRN
  Administered 2014-12-16: 80 ug via INTRAVENOUS

## 2014-12-16 MED ORDER — LABETALOL HCL 5 MG/ML IV SOLN
INTRAVENOUS | Status: AC
Start: 2014-12-16 — End: 2014-12-16
  Filled 2014-12-16: qty 4

## 2014-12-16 MED ORDER — DEXAMETHASONE SODIUM PHOSPHATE 4 MG/ML IJ SOLN
INTRAMUSCULAR | Status: AC
  Filled 2014-12-16: qty 1

## 2014-12-16 MED ORDER — SCOPOLAMINE 1 MG/3DAYS TD PT72
MEDICATED_PATCH | TRANSDERMAL | Status: AC
  Administered 2014-12-16: 1.5 mg via TRANSDERMAL
  Filled 2014-12-16: qty 1

## 2014-12-16 MED ORDER — KETOROLAC TROMETHAMINE 30 MG/ML IJ SOLN
INTRAMUSCULAR | Status: DC | PRN
  Administered 2014-12-16: 30 mg via INTRAVENOUS

## 2014-12-16 MED ORDER — DEXAMETHASONE SODIUM PHOSPHATE 10 MG/ML IJ SOLN
INTRAMUSCULAR | Status: DC | PRN
  Administered 2014-12-16: 4 mg via INTRAVENOUS

## 2014-12-16 MED ORDER — MIDAZOLAM HCL 2 MG/2ML IJ SOLN
INTRAMUSCULAR | Status: DC | PRN
  Administered 2014-12-16: 2 mg via INTRAVENOUS

## 2014-12-16 MED ORDER — GLYCOPYRROLATE 0.2 MG/ML IJ SOLN
INTRAMUSCULAR | Status: AC
  Filled 2014-12-16: qty 3

## 2014-12-16 MED ORDER — SCOPOLAMINE 1 MG/3DAYS TD PT72
1.0000 | MEDICATED_PATCH | Freq: Once | TRANSDERMAL | Status: DC
  Administered 2014-12-16: 1.5 mg via TRANSDERMAL

## 2014-12-16 SURGICAL SUPPLY — 15 items
CATH ROBINSON RED A/P 16FR (CATHETERS) ×3 IMPLANT
CLOTH BEACON ORANGE TIMEOUT ST (SAFETY) ×3 IMPLANT
CONTAINER PREFILL 10% NBF 60ML (FORM) ×6 IMPLANT
DEVICE MYOSURE CLASSIC (MISCELLANEOUS) ×2 IMPLANT
DEVICE MYOSURE LITE (MISCELLANEOUS) IMPLANT
FILTER ARTHROSCOPY CONVERTOR (FILTER) ×3 IMPLANT
GLOVE BIO SURGEON STRL SZ7.5 (GLOVE) ×6 IMPLANT
GOWN STRL REUS W/TWL LRG LVL3 (GOWN DISPOSABLE) ×6 IMPLANT
PACK VAGINAL MINOR WOMEN LF (CUSTOM PROCEDURE TRAY) ×3 IMPLANT
PAD OB MATERNITY 4.3X12.25 (PERSONAL CARE ITEMS) ×3 IMPLANT
SEAL ROD LENS SCOPE MYOSURE (ABLATOR) ×3 IMPLANT
TOWEL OR 17X24 6PK STRL BLUE (TOWEL DISPOSABLE) ×6 IMPLANT
TUBING AQUILEX INFLOW (TUBING) ×3 IMPLANT
TUBING AQUILEX OUTFLOW (TUBING) ×3 IMPLANT
WATER STERILE IRR 1000ML POUR (IV SOLUTION) ×3 IMPLANT

## 2014-12-16 NOTE — Anesthesia Preprocedure Evaluation (Signed)
Anesthesia Evaluation  Patient identified by MRN, date of birth, ID band Patient awake    Reviewed: Allergy & Precautions, NPO status , Patient's Chart, lab work & pertinent test results  History of Anesthesia Complications Negative for: history of anesthetic complications  Airway Mallampati: II  TM Distance: >3 FB Neck ROM: Full    Dental no notable dental hx. (+) Dental Advisory Given   Pulmonary neg pulmonary ROS,    Pulmonary exam normal breath sounds clear to auscultation       Cardiovascular negative cardio ROS Normal cardiovascular exam Rhythm:Regular Rate:Normal     Neuro/Psych negative neurological ROS  negative psych ROS   GI/Hepatic negative GI ROS, Neg liver ROS,   Endo/Other  obesity  Renal/GU negative Renal ROS  negative genitourinary   Musculoskeletal negative musculoskeletal ROS (+)   Abdominal   Peds negative pediatric ROS (+)  Hematology  (+) anemia ,   Anesthesia Other Findings   Reproductive/Obstetrics negative OB ROS                             Anesthesia Physical Anesthesia Plan  ASA: II  Anesthesia Plan: General   Post-op Pain Management:    Induction: Intravenous  Airway Management Planned: LMA  Additional Equipment:   Intra-op Plan:   Post-operative Plan: Extubation in OR  Informed Consent: I have reviewed the patients History and Physical, chart, labs and discussed the procedure including the risks, benefits and alternatives for the proposed anesthesia with the patient or authorized representative who has indicated his/her understanding and acceptance.   Dental advisory given  Plan Discussed with: CRNA  Anesthesia Plan Comments:         Anesthesia Quick Evaluation

## 2014-12-16 NOTE — Transfer of Care (Signed)
Immediate Anesthesia Transfer of Care Note  Patient: Courtney Bowman  Procedure(s) Performed: Procedure(s): DILATATION & CURETTAGE/HYSTEROSCOPY WITH MYOSURE (N/A)  Patient Location: PACU  Anesthesia Type:General  Level of Consciousness: awake  Airway & Oxygen Therapy: Patient Spontanous Breathing  Post-op Assessment: Report given to PACU RN  Post vital signs: stable  Filed Vitals:   12/16/14 0911  BP: 160/102  Pulse: 88  Temp: 37 C  Resp: 18    Complications: No apparent anesthesia complications

## 2014-12-16 NOTE — H&P (Signed)
  The patient was examined.  I reviewed the proposed surgery and consent form with the patient.  The dictated history and physical is current and accurate and all questions were answered. The patient is ready to proceed with surgery and has a realistic understanding and expectation for the outcome.   Anastasio Auerbach MD, 9:34 AM 12/16/2014

## 2014-12-16 NOTE — Discharge Instructions (Signed)
° °  Postoperative Instructions Hysteroscopy D & C ° °Dr. Paizlee Kinder and the nursing staff have discussed postoperative instructions with you.  If you have any questions please ask them before you leave the hospital, or call Dr Trinnity Breunig’s office at [phone].   ° °We would like to emphasize the following instructions: ° ° °? Call the office to make your follow-up appointment as recommended by Dr Antonie Borjon (usually 1-2 weeks). ° °? You were given a prescription, or one was ordered for you at the pharmacy you designated.  Get that prescription filled and take the medication according to instructions. ° °? You may eat a regular diet, but slowly until you start having bowel movements. ° °? Drink plenty of water daily. ° °? Nothing in the vagina (intercourse, douching, objects of any kind) for two weeks.  When reinitiating intercourse, if it is uncomfortable, stop and make an appointment with Dr Zamar Odwyer to be evaluated. ° °? No driving for one to two days until the effects of anesthesia has worn off.  No traveling out of town for several days. ° °? You may shower, but no baths for one week.  Walking up and down stairs is ok.  No heavy lifting, prolonged standing, repeated bending or any “working out” until your post op check. ° °? Rest frequently, listen to your body and do not push yourself and overdo it. ° °? Call if: ° °o Your pain medication does not seem strong enough. °o Worsening pain or abdominal bloating °o Persistent nausea or vomiting °o Difficulty with urination or bowel movements. °o Temperature of 101 degrees or higher. °o Heavy vaginal bleeding.  If your period is due, you may use tampons. °o You have any questions or concerns ° ° ° °

## 2014-12-16 NOTE — Anesthesia Postprocedure Evaluation (Signed)
  Anesthesia Post-op Note  Patient: Courtney Bowman  Procedure(s) Performed: Procedure(s) (LRB): DILATATION & CURETTAGE/HYSTEROSCOPY WITH MYOSURE (N/A)  Patient Location: PACU  Anesthesia Type: General  Level of Consciousness: awake and alert   Airway and Oxygen Therapy: Patient Spontanous Breathing  Post-op Pain: mild  Post-op Assessment: Post-op Vital signs reviewed, Patient's Cardiovascular Status Stable, Respiratory Function Stable, Patent Airway and No signs of Nausea or vomiting  Last Vitals:  Filed Vitals:   12/16/14 1103  BP: 170/89  Pulse: 104  Temp: 36.6 C  Resp: 16    Post-op Vital Signs: stable   Complications: No apparent anesthesia complications

## 2014-12-16 NOTE — Anesthesia Procedure Notes (Signed)
Procedure Name: LMA Insertion Date/Time: 12/16/2014 10:25 AM Performed by: Casimer Lanius A Pre-anesthesia Checklist: Patient being monitored, Patient identified, Emergency Drugs available and Suction available Patient Re-evaluated:Patient Re-evaluated prior to inductionOxygen Delivery Method: Circle system utilized Preoxygenation: Pre-oxygenation with 100% oxygen Intubation Type: IV induction and Inhalational induction Ventilation: Mask ventilation without difficulty LMA: LMA inserted LMA Size: 4.0 Number of attempts: 1 Dental Injury: Teeth and Oropharynx as per pre-operative assessment

## 2014-12-16 NOTE — Op Note (Signed)
Courtney Bowman [birth year] [id number]   Post Operative Note   Date of surgery:  12/16/2014  Pre Op Dx:  Irregular bleeding, leiomyoma, endometrial polyp, anemia  Post Op Dx:   same  Procedure:  Hysteroscopy, D&C, Myosure resection endometrial polyp  Surgeon:  Donalynn Furlong P  Anesthesia:  General  EBL:  minimal  Distended media discrepancy:  659 cc saline  Complications:  None  Specimen:  #1 endometrial polyp #2 endometrial curetting to pathology  Findings: EUA:  External BUS vagina normal. Cervix normal. Uterus retroverted bulky irregular consistent with leiomyoma. Adnexa without gross masses   Hysteroscopy; Adequate noting fundus, anterior/posterior uterine surfaces, lower uterine segment, endocervical canal, right and left tubal ostia all visualized. Single endometrial polyp mid upper posterior endometrial surface.  resected in its entirety  Procedure:  Patient was taken to the operating room, placed in the low dorsal lithotomy position, underwent perineal/vaginal preparation with Betadine solution and the bladder emptied with in and out Foley catheterization per nursing personnel. The timeout was performed by the surgical team. An EUA was performed and the patient was draped in usual fashion. The cervix was visualized with a speculum, anterior lip grasped with a single-tooth tenaculum and a paracervical block using 1% Xylocaine, 10 cc total was placed. The cervix was gently dilated to admit the Myosure hysteroscope and hysteroscopy was performed with findings noted above. Using the Myosure resectoscopic wand the polyp was resected in its entirety to the level of the surrounding endometrium without difficulty. A gentle sharp curettage was then performed and the specimens sent separately to pathology. Repeat hysteroscopy showed an empty cavity with good distention and no evidence of perforation. Removal of the single-tooth tenaculum showed adequate hemostasis at both the tenaculum site  and external os. The speculum was removed, the patient received intraoperative Toradol, was awakened without difficulty and taken to the recovery room in good condition having  tolerated procedure well.     Anastasio Auerbach MD, 10:59 AM 12/16/2014

## 2014-12-17 ENCOUNTER — Encounter (HOSPITAL_COMMUNITY): Payer: Self-pay | Admitting: Gynecology

## 2015-01-02 ENCOUNTER — Telehealth: Payer: Self-pay

## 2015-01-02 NOTE — Telephone Encounter (Signed)
Per Dr. Loetta Rough office note 12/11/14 "Patient is going to stop her oral contraceptives preoperatively and restart them postoperatively. The need to use backup contraception stressed."  Patient had D&C, Hysteroscopy on 12/16/14.  She called today to say her period started yesterday and is it okay for her to re-start her bcp on Sunday?

## 2015-01-02 NOTE — Telephone Encounter (Signed)
Yes, backup first month also

## 2015-01-02 NOTE — Telephone Encounter (Signed)
Patient informed. 

## 2015-01-26 ENCOUNTER — Telehealth: Payer: Self-pay | Admitting: *Deleted

## 2015-01-26 MED ORDER — NORETHIN ACE-ETH ESTRAD-FE 1-20 MG-MCG PO TABS: 1.0000 | ORAL_TABLET | Freq: Every day | ORAL | Status: DC

## 2015-01-26 NOTE — Telephone Encounter (Signed)
Pt called requesting refill on birth control pills per telephone encounter 01/02/15 Rx will be sent.

## 2015-04-01 ENCOUNTER — Other Ambulatory Visit: Payer: Self-pay | Admitting: Women's Health

## 2015-05-15 ENCOUNTER — Telehealth: Payer: Self-pay | Admitting: *Deleted

## 2015-05-15 MED ORDER — NORETHIN ACE-ETH ESTRAD-FE 1-20 MG-MCG PO TABS: 1.0000 | ORAL_TABLET | Freq: Every day | ORAL | Status: DC

## 2015-05-15 NOTE — Telephone Encounter (Signed)
Pt called requesting refill on birth control pills, had D&C on 12/16/14. Per TF note 12/11/14 "Patient is going to stop her oral contraceptives preoperatively and restart them postoperatively. The need to use backup contraception stressed. Pt last annual was in Nov.2015. Okay to fill?

## 2015-05-15 NOTE — Telephone Encounter (Signed)
Pt informed with the below, note.

## 2015-05-15 NOTE — Telephone Encounter (Signed)
Okay refill  2 months, start Sunday after cycle starts, condoms until after first month, schedule annual exam.

## 2015-06-17 ENCOUNTER — Other Ambulatory Visit: Payer: Self-pay

## 2015-06-17 DIAGNOSIS — Z1231 Encounter for screening mammogram for malignant neoplasm of breast: Secondary | ICD-10-CM

## 2015-07-01 ENCOUNTER — Encounter: Payer: Self-pay | Admitting: Women's Health

## 2015-07-01 ENCOUNTER — Ambulatory Visit (INDEPENDENT_AMBULATORY_CARE_PROVIDER_SITE_OTHER): Payer: 59 | Admitting: Women's Health

## 2015-07-01 VITALS — BP 148/80 | Ht 67.0 in | Wt 190.0 lb

## 2015-07-01 DIAGNOSIS — Z113 Encounter for screening for infections with a predominantly sexual mode of transmission: Secondary | ICD-10-CM | POA: Diagnosis not present

## 2015-07-01 DIAGNOSIS — Z01419 Encounter for gynecological examination (general) (routine) without abnormal findings: Secondary | ICD-10-CM | POA: Diagnosis not present

## 2015-07-01 DIAGNOSIS — Z13220 Encounter for screening for lipoid disorders: Secondary | ICD-10-CM

## 2015-07-01 DIAGNOSIS — Z3041 Encounter for surveillance of contraceptive pills: Secondary | ICD-10-CM

## 2015-07-01 LAB — LIPID PANEL
CHOLESTEROL: 143 mg/dL (ref 125–200)
HDL: 38 mg/dL — ABNORMAL LOW (ref >=46)
LDL Cholesterol: 66 mg/dL (ref <130)
TRIGLYCERIDES: 194 mg/dL — AB (ref <150)
Total CHOL/HDL Ratio: 3.8 Ratio (ref <=5.0)
VLDL: 39 mg/dL — ABNORMAL HIGH (ref <30)

## 2015-07-01 LAB — COMPREHENSIVE METABOLIC PANEL
ALBUMIN: 3.9 g/dL (ref 3.6–5.1)
ALK PHOS: 61 U/L (ref 33–115)
ALT: 14 U/L (ref 6–29)
AST: 12 U/L (ref 10–35)
BILIRUBIN TOTAL: 0.4 mg/dL (ref 0.2–1.2)
BUN: 15 mg/dL (ref 7–25)
CHLORIDE: 105 mmol/L (ref 98–110)
CO2: 26 mmol/L (ref 20–31)
CREATININE: 0.61 mg/dL (ref 0.50–1.10)
Calcium: 9.7 mg/dL (ref 8.6–10.2)
Glucose, Bld: 90 mg/dL (ref 65–99)
Potassium: 4.7 mmol/L (ref 3.5–5.3)
SODIUM: 139 mmol/L (ref 135–146)
TOTAL PROTEIN: 7 g/dL (ref 6.1–8.1)

## 2015-07-01 LAB — CBC WITH DIFFERENTIAL/PLATELET
BASOS ABS: 89 {cells}/uL (ref 0–200)
BASOS PCT: 1 %
EOS ABS: 178 {cells}/uL (ref 15–500)
Eosinophils Relative: 2 %
HCT: 29.5 % — ABNORMAL LOW (ref 35.0–45.0)
HEMOGLOBIN: 8.6 g/dL — AB (ref 11.7–15.5)
LYMPHS ABS: 2759 {cells}/uL (ref 850–3900)
Lymphocytes Relative: 31 %
MCH: 19.3 pg — AB (ref 27.0–33.0)
MCHC: 29.2 g/dL — ABNORMAL LOW (ref 32.0–36.0)
MCV: 66.3 fL — AB (ref 80.0–100.0)
MONO ABS: 623 {cells}/uL (ref 200–950)
MPV: 9.3 fL (ref 7.5–12.5)
Monocytes Relative: 7 %
NEUTROS ABS: 5251 {cells}/uL (ref 1500–7800)
Neutrophils Relative %: 59 %
Platelets: 496 10*3/uL — ABNORMAL HIGH (ref 140–400)
RBC: 4.45 MIL/uL (ref 3.80–5.10)
RDW: 17.5 % — ABNORMAL HIGH (ref 11.0–15.0)
WBC: 8.9 10*3/uL (ref 3.8–10.8)

## 2015-07-01 LAB — HEPATITIS B SURFACE ANTIGEN: Hepatitis B Surface Ag: NEGATIVE

## 2015-07-01 LAB — HEPATITIS C ANTIBODY: HCV AB: NEGATIVE

## 2015-07-01 LAB — HIV ANTIBODY (ROUTINE TESTING W REFLEX): HIV 1&2 Ab, 4th Generation: NONREACTIVE

## 2015-07-01 NOTE — Patient Instructions (Signed)
Health Maintenance, Female Adopting a healthy lifestyle and getting preventive care can go a long way to promote health and wellness. Talk with your health care provider about what schedule of regular examinations is right for you. This is a good chance for you to check in with your provider about disease prevention and staying healthy. In between checkups, there are plenty of things you can do on your own. Experts have done a lot of research about which lifestyle changes and preventive measures are most likely to keep you healthy. Ask your health care provider for more information. WEIGHT AND DIET  Eat a healthy diet  Be sure to include plenty of vegetables, fruits, low-fat dairy products, and lean protein.  Do not eat a lot of foods high in solid fats, added sugars, or salt.  Get regular exercise. This is one of the most important things you can do for your health.  Most adults should exercise for at least 150 minutes each week. The exercise should increase your heart rate and make you sweat (moderate-intensity exercise).  Most adults should also do strengthening exercises at least twice a week. This is in addition to the moderate-intensity exercise.  Maintain a healthy weight  Body mass index (BMI) is a measurement that can be used to identify possible weight problems. It estimates body fat based on height and weight. Your health care provider can help determine your BMI and help you achieve or maintain a healthy weight.  For females 20 years of age and older:   A BMI below 18.5 is considered underweight.  A BMI of 18.5 to 24.9 is normal.  A BMI of 25 to 29.9 is considered overweight.  A BMI of 30 and above is considered obese.  Watch levels of cholesterol and blood lipids  You should start having your blood tested for lipids and cholesterol at 46 years of age, then have this test every 5 years.  You may need to have your cholesterol levels checked more often if:  Your lipid  or cholesterol levels are high.  You are older than 46 years of age.  You are at high risk for heart disease.  CANCER SCREENING   Lung Cancer  Lung cancer screening is recommended for adults 55-80 years old who are at high risk for lung cancer because of a history of smoking.  A yearly low-dose CT scan of the lungs is recommended for people who:  Currently smoke.  Have quit within the past 15 years.  Have at least a 30-pack-year history of smoking. A pack year is smoking an average of one pack of cigarettes a day for 1 year.  Yearly screening should continue until it has been 15 years since you quit.  Yearly screening should stop if you develop a health problem that would prevent you from having lung cancer treatment.  Breast Cancer  Practice breast self-awareness. This means understanding how your breasts normally appear and feel.  It also means doing regular breast self-exams. Let your health care provider know about any changes, no matter how small.  If you are in your 20s or 30s, you should have a clinical breast exam (CBE) by a health care provider every 1-3 years as part of a regular health exam.  If you are 40 or older, have a CBE every year. Also consider having a breast X-ray (mammogram) every year.  If you have a family history of breast cancer, talk to your health care provider about genetic screening.  If you   are at high risk for breast cancer, talk to your health care provider about having an MRI and a mammogram every year.  Breast cancer gene (BRCA) assessment is recommended for women who have family members with BRCA-related cancers. BRCA-related cancers include:  Breast.  Ovarian.  Tubal.  Peritoneal cancers.  Results of the assessment will determine the need for genetic counseling and BRCA1 and BRCA2 testing. Cervical Cancer Your health care provider may recommend that you be screened regularly for cancer of the pelvic organs (ovaries, uterus, and  vagina). This screening involves a pelvic examination, including checking for microscopic changes to the surface of your cervix (Pap test). You may be encouraged to have this screening done every 3 years, beginning at age 21.  For women ages 30-65, health care providers may recommend pelvic exams and Pap testing every 3 years, or they may recommend the Pap and pelvic exam, combined with testing for human papilloma virus (HPV), every 5 years. Some types of HPV increase your risk of cervical cancer. Testing for HPV may also be done on women of any age with unclear Pap test results.  Other health care providers may not recommend any screening for nonpregnant women who are considered low risk for pelvic cancer and who do not have symptoms. Ask your health care provider if a screening pelvic exam is right for you.  If you have had past treatment for cervical cancer or a condition that could lead to cancer, you need Pap tests and screening for cancer for at least 20 years after your treatment. If Pap tests have been discontinued, your risk factors (such as having a new sexual partner) need to be reassessed to determine if screening should resume. Some women have medical problems that increase the chance of getting cervical cancer. In these cases, your health care provider may recommend more frequent screening and Pap tests. Colorectal Cancer  This type of cancer can be detected and often prevented.  Routine colorectal cancer screening usually begins at 46 years of age and continues through 46 years of age.  Your health care provider may recommend screening at an earlier age if you have risk factors for colon cancer.  Your health care provider may also recommend using home test kits to check for hidden blood in the stool.  A small camera at the end of a tube can be used to examine your colon directly (sigmoidoscopy or colonoscopy). This is done to check for the earliest forms of colorectal  cancer.  Routine screening usually begins at age 50.  Direct examination of the colon should be repeated every 5-10 years through 46 years of age. However, you may need to be screened more often if early forms of precancerous polyps or small growths are found. Skin Cancer  Check your skin from head to toe regularly.  Tell your health care provider about any new moles or changes in moles, especially if there is a change in a mole's shape or color.  Also tell your health care provider if you have a mole that is larger than the size of a pencil eraser.  Always use sunscreen. Apply sunscreen liberally and repeatedly throughout the day.  Protect yourself by wearing long sleeves, pants, a wide-brimmed hat, and sunglasses whenever you are outside. HEART DISEASE, DIABETES, AND HIGH BLOOD PRESSURE   High blood pressure causes heart disease and increases the risk of stroke. High blood pressure is more likely to develop in:  People who have blood pressure in the high end   of the normal range (130-139/85-89 mm Hg).  People who are overweight or obese.  People who are African American.  If you are 38-23 years of age, have your blood pressure checked every 3-5 years. If you are 61 years of age or older, have your blood pressure checked every year. You should have your blood pressure measured twice--once when you are at a hospital or clinic, and once when you are not at a hospital or clinic. Record the average of the two measurements. To check your blood pressure when you are not at a hospital or clinic, you can use:  An automated blood pressure machine at a pharmacy.  A home blood pressure monitor.  If you are between 45 years and 39 years old, ask your health care provider if you should take aspirin to prevent strokes.  Have regular diabetes screenings. This involves taking a blood sample to check your fasting blood sugar level.  If you are at a normal weight and have a low risk for diabetes,  have this test once every three years after 46 years of age.  If you are overweight and have a high risk for diabetes, consider being tested at a younger age or more often. PREVENTING INFECTION  Hepatitis B  If you have a higher risk for hepatitis B, you should be screened for this virus. You are considered at high risk for hepatitis B if:  You were born in a country where hepatitis B is common. Ask your health care provider which countries are considered high risk.  Your parents were born in a high-risk country, and you have not been immunized against hepatitis B (hepatitis B vaccine).  You have HIV or AIDS.  You use needles to inject street drugs.  You live with someone who has hepatitis B.  You have had sex with someone who has hepatitis B.  You get hemodialysis treatment.  You take certain medicines for conditions, including cancer, organ transplantation, and autoimmune conditions. Hepatitis C  Blood testing is recommended for:  Everyone born from [birth year] through 1966.  Anyone with known risk factors for hepatitis C. Sexually transmitted infections (STIs)  You should be screened for sexually transmitted infections (STIs) including gonorrhea and chlamydia if:  You are sexually active and are younger than 46 years of age.  You are older than 46 years of age and your health care provider tells you that you are at risk for this type of infection.  Your sexual activity has changed since you were last screened and you are at an increased risk for chlamydia or gonorrhea. Ask your health care provider if you are at risk.  If you do not have HIV, but are at risk, it may be recommended that you take a prescription medicine daily to prevent HIV infection. This is called pre-exposure prophylaxis (PrEP). You are considered at risk if:  You are sexually active and do not regularly use condoms or know the HIV status of your partner(s).  You take drugs by injection.  You are sexually  active with a partner who has HIV. Talk with your health care provider about whether you are at high risk of being infected with HIV. If you choose to begin PrEP, you should first be tested for HIV. You should then be tested every 3 months for as long as you are taking PrEP.  PREGNANCY   If you are premenopausal and you may become pregnant, ask your health care provider about preconception counseling.  If you may  become pregnant, take 400 to 800 micrograms (mcg) of folic acid every day.  If you want to prevent pregnancy, talk to your health care provider about birth control (contraception). OSTEOPOROSIS AND MENOPAUSE   Osteoporosis is a disease in which the bones lose minerals and strength with aging. This can result in serious bone fractures. Your risk for osteoporosis can be identified using a bone density scan.  If you are 61 years of age or older, or if you are at risk for osteoporosis and fractures, ask your health care provider if you should be screened.  Ask your health care provider whether you should take a calcium or vitamin D supplement to lower your risk for osteoporosis.  Menopause may have certain physical symptoms and risks.  Hormone replacement therapy may reduce some of these symptoms and risks. Talk to your health care provider about whether hormone replacement therapy is right for you.  HOME CARE INSTRUCTIONS   Schedule regular health, dental, and eye exams.  Stay current with your immunizations.   Do not use any tobacco products including cigarettes, chewing tobacco, or electronic cigarettes.  If you are pregnant, do not drink alcohol.  If you are breastfeeding, limit how much and how often you drink alcohol.  Limit alcohol intake to no more than 1 drink per day for nonpregnant women. One drink equals 12 ounces of beer, 5 ounces of wine, or 1 ounces of hard liquor.  Do not use street drugs.  Do not share needles.  Ask your health care provider for help if  you need support or information about quitting drugs.  Tell your health care provider if you often feel depressed.  Tell your health care provider if you have ever been abused or do not feel safe at home.   This information is not intended to replace advice given to you by your health care provider. Make sure you discuss any questions you have with your health care provider.   Document Released: 09/27/2010 Document Revised: 04/04/2014 Document Reviewed: 02/13/2013 Elsevier Interactive Patient Education [employer].

## 2015-07-01 NOTE — Progress Notes (Addendum)
Shauntea L Feenstra 10/01/1970 [id number]    History:    Presents for annual exam. Monthly cycle on Loestrin/condoms/same partner. 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 Normal mammogram history. 11/2014 benign endometrial polyp. Planning to have tummy tuck has preop appointment tomorrow with Dr. Truesdale.   Past medical history, past surgical history, family history and social history were all reviewed and documented in the EPIC chart. Works in insurance. Courtney at [employer] , Julian 15 both doing well. Father multiple myeloma, mother healthy.  ROS:  A ROS was performed and pertinent positives and negatives are included.  Exam:  Filed Vitals:   07/01/15 1135  BP: 148/80    General appearance:  Normal Thyroid:  Symmetrical, normal in size, without palpable masses or nodularity. Respiratory  Auscultation:  Clear without wheezing or rhonchi Cardiovascular  Auscultation:  Regular rate, without rubs, murmurs or gallops  Edema/varicosities:  Not grossly evident Abdominal  Soft,nontender, without masses, guarding or rebound.  Liver/spleen:  No organomegaly noted  Hernia:  None appreciated  Skin  Inspection:  Grossly normal   Breasts: Examined lying and sitting.     Right: Without masses, retractions, discharge or axillary adenopathy.     Left: Without masses, retractions, discharge or axillary adenopathy. Gentitourinary   Inguinal/mons:  Normal without inguinal adenopathy  External genitalia:  Normal  BUS/Urethra/Skene's glands:  Normal  Vagina:  Normal  Cervix:  Normal  Uterus:   normal in size, shape and contour.  Midline and mobile  Adnexa/parametria:     Rt: Without masses or tenderness.   Lt: Without masses or tenderness.  Anus and perineum: Normal  Digital rectal exam: Normal sphincter tone without palpated masses or tenderness  Assessment/Plan:  45 y.o. SBF G2 P2 for annual exam.     Monthly cycle/Loestrin/condoms/same partner STD screen Ascus with positive HR HPV history Borderline BP  Plan: Contraception reviewed since blood pressure slightly elevated will change to Micronor, reviewed no placebo week start with next pack take daily at about the same time. SBE's, annual screening mammogram, 3-D tomography reviewed and encouraged history of dense breasts. Encouraged exercise, decreasing calories has had a 9 pound weight gain in the past year. CBC, lipid panel, TSH, CMP, UA, Pap with HR HPV typing, new screening guidelines reviewed. GC/Chlamydia, HIV, hep B, C, RPR. Aware of blood pressure slightly elevated today reports has been normal in the past has preop visit tomorrow with Dr. Truesdale.   YOUNG,NANCY J WHNP, 11:38 AM 07/01/2015  

## 2015-07-02 LAB — RPR

## 2015-07-02 LAB — URINALYSIS W MICROSCOPIC + REFLEX CULTURE
Bilirubin Urine: NEGATIVE
CASTS: NONE SEEN [LPF]
CRYSTALS: NONE SEEN [HPF]
Glucose, UA: NEGATIVE
Ketones, ur: NEGATIVE
Nitrite: NEGATIVE
Protein, ur: NEGATIVE
SPECIFIC GRAVITY, URINE: 1.022 (ref 1.001–1.035)
YEAST: NONE SEEN [HPF]
pH: 7 (ref 5.0–8.0)

## 2015-07-02 MED ORDER — NORETHINDRONE 0.35 MG PO TABS: 1.0000 | ORAL_TABLET | Freq: Every day | ORAL | Status: DC

## 2015-07-02 NOTE — Addendum Note (Signed)
Addended by: Huel Cote on: 07/02/2015 07:38 AM   Modules accepted: Orders, Medications

## 2015-07-03 LAB — PAP IG, CT-NG NAA, HPV HIGH-RISK
Chlamydia Probe Amp: NOT DETECTED
GC Probe Amp: NOT DETECTED
HPV DNA HIGH RISK: NOT DETECTED

## 2015-07-05 LAB — URINE CULTURE: Colony Count: 50000

## 2015-07-06 ENCOUNTER — Encounter: Payer: 59 | Admitting: Women's Health

## 2015-08-03 ENCOUNTER — Ambulatory Visit: Payer: 59

## 2015-08-25 ENCOUNTER — Ambulatory Visit: Payer: 59

## 2015-08-28 ENCOUNTER — Ambulatory Visit
Admission: RE | Admit: 2015-08-28 | Discharge: 2015-08-28 | Disposition: A | Discharge status: Home / Self Care | Payer: 59 | Source: Ambulatory Visit

## 2015-08-28 ENCOUNTER — Other Ambulatory Visit: Payer: Self-pay

## 2015-08-28 DIAGNOSIS — Z1231 Encounter for screening mammogram for malignant neoplasm of breast: Secondary | ICD-10-CM

## 2015-09-09 ENCOUNTER — Other Ambulatory Visit: Payer: Self-pay | Admitting: Women's Health

## 2015-09-09 DIAGNOSIS — Z09 Encounter for follow-up examination after completed treatment for conditions other than malignant neoplasm: Secondary | ICD-10-CM

## 2015-09-10 ENCOUNTER — Other Ambulatory Visit: Payer: Self-pay | Admitting: Women's Health

## 2015-09-10 DIAGNOSIS — R928 Other abnormal and inconclusive findings on diagnostic imaging of breast: Secondary | ICD-10-CM

## 2015-09-15 ENCOUNTER — Telehealth: Payer: Self-pay | Admitting: *Deleted

## 2015-09-15 NOTE — Telephone Encounter (Signed)
Message left, birth control pills were changed from Loestrin to Micronor because blood pressure was elevated

## 2015-09-15 NOTE — Telephone Encounter (Signed)
Pt called requesting to start back on her old Rx birth control ( appears to be Loestrin Fe) , states current ortho micronor is causing her cycle to start earlier. Please advise

## 2015-09-16 ENCOUNTER — Other Ambulatory Visit: Payer: Self-pay | Admitting: Women's Health

## 2015-09-16 DIAGNOSIS — Z3041 Encounter for surveillance of contraceptive pills: Secondary | ICD-10-CM

## 2015-09-16 MED ORDER — NORETHIN-ETH ESTRAD-FE BIPHAS 1 MG-10 MCG / 10 MCG PO TABS: 1.0000 | ORAL_TABLET | Freq: Every day | ORAL | Status: DC

## 2015-09-16 NOTE — Telephone Encounter (Signed)
TC reviewed birth control pills were changed to Micronor due to blood pressure being slightly elevated at office visit, has had increased irregular bleeding on Micronor and would like to change back. States had a tummy tuck 2 months ago blood pressure was low at the office, with surgery and after. Will continue to monitor blood pressure. Will try lo Loestrin prescription E scribed 3 month supply will call back with blood pressure readings if continues low will renew.

## 2015-10-05 ENCOUNTER — Other Ambulatory Visit: Payer: 59

## 2015-10-26 ENCOUNTER — Ambulatory Visit
Admission: RE | Admit: 2015-10-26 | Discharge: 2015-10-26 | Disposition: A | Discharge status: Home / Self Care | Payer: 59 | Source: Ambulatory Visit | Attending: Women's Health | Admitting: Women's Health

## 2015-10-26 DIAGNOSIS — Z09 Encounter for follow-up examination after completed treatment for conditions other than malignant neoplasm: Secondary | ICD-10-CM

## 2015-10-26 DIAGNOSIS — R928 Other abnormal and inconclusive findings on diagnostic imaging of breast: Secondary | ICD-10-CM

## 2015-12-04 ENCOUNTER — Telehealth: Payer: Self-pay | Admitting: *Deleted

## 2015-12-04 DIAGNOSIS — Z3041 Encounter for surveillance of contraceptive pills: Secondary | ICD-10-CM

## 2015-12-04 MED ORDER — NORETHIN-ETH ESTRAD-FE BIPHAS 1 MG-10 MCG / 10 MCG PO TABS: 1.0000 | ORAL_TABLET | Freq: Every day | ORAL | 2 refills | Status: DC

## 2015-12-04 NOTE — Telephone Encounter (Signed)
Pt called requesting refill on birth control pills at mail order, you told pt to keep track of blood pressure on telephone encounter 09/15/15, said most recent reading was 128/83 this was Monday. Okay to send refills in?

## 2015-12-04 NOTE — Telephone Encounter (Signed)
Okay for refills but have her call if blood pressure greater than 130/80, have her limit salt intake and work on getting regular exercise

## 2015-12-04 NOTE — Telephone Encounter (Signed)
Pt informed, Rx sent. 

## 2016-04-08 ENCOUNTER — Telehealth: Payer: Self-pay | Admitting: *Deleted

## 2016-04-08 NOTE — Telephone Encounter (Signed)
Pt called and left message in triage voicemail requesting to speak with nurse about cycles.

## 2016-07-05 ENCOUNTER — Ambulatory Visit (INDEPENDENT_AMBULATORY_CARE_PROVIDER_SITE_OTHER): Payer: 59 | Admitting: Women's Health

## 2016-07-05 ENCOUNTER — Encounter: Payer: Self-pay | Admitting: Women's Health

## 2016-07-05 VITALS — BP 134/82 | Ht 67.0 in | Wt 182.0 lb

## 2016-07-05 DIAGNOSIS — Z13220 Encounter for screening for lipoid disorders: Secondary | ICD-10-CM | POA: Diagnosis not present

## 2016-07-05 DIAGNOSIS — N760 Acute vaginitis: Secondary | ICD-10-CM | POA: Diagnosis not present

## 2016-07-05 DIAGNOSIS — Z113 Encounter for screening for infections with a predominantly sexual mode of transmission: Secondary | ICD-10-CM

## 2016-07-05 DIAGNOSIS — Z3041 Encounter for surveillance of contraceptive pills: Secondary | ICD-10-CM | POA: Diagnosis not present

## 2016-07-05 DIAGNOSIS — B9689 Other specified bacterial agents as the cause of diseases classified elsewhere: Secondary | ICD-10-CM | POA: Diagnosis not present

## 2016-07-05 DIAGNOSIS — Z01419 Encounter for gynecological examination (general) (routine) without abnormal findings: Secondary | ICD-10-CM

## 2016-07-05 LAB — CBC WITH DIFFERENTIAL/PLATELET
BASOS PCT: 0 %
Basophils Absolute: 0 cells/uL (ref 0–200)
Eosinophils Absolute: 204 cells/uL (ref 15–500)
Eosinophils Relative: 2 %
HEMATOCRIT: 29.2 % — AB (ref 35.0–45.0)
Hemoglobin: 8.3 g/dL — ABNORMAL LOW (ref 11.7–15.5)
LYMPHS ABS: 3264 {cells}/uL (ref 850–3900)
LYMPHS PCT: 32 %
MCH: 18.6 pg — ABNORMAL LOW (ref 27.0–33.0)
MCHC: 28.8 g/dL — ABNORMAL LOW (ref 32.0–36.0)
MCV: 65.6 fL — ABNORMAL LOW (ref 80.0–100.0)
MONO ABS: 714 {cells}/uL (ref 200–950)
MPV: 9.5 fL (ref 7.5–12.5)
Monocytes Relative: 7 %
Neutro Abs: 6018 cells/uL (ref 1500–7800)
Neutrophils Relative %: 59 %
PLATELETS: 466 10*3/uL — AB (ref 140–400)
RBC: 4.46 MIL/uL (ref 3.80–5.10)
RDW: 18.7 % — ABNORMAL HIGH (ref 11.0–15.0)
WBC: 10.2 10*3/uL (ref 3.8–10.8)

## 2016-07-05 LAB — LIPID PANEL
Cholesterol: 134 mg/dL (ref <200)
HDL: 46 mg/dL — ABNORMAL LOW (ref >50)
LDL Cholesterol: 54 mg/dL (ref <100)
Total CHOL/HDL Ratio: 2.9 Ratio (ref <5.0)
Triglycerides: 169 mg/dL — ABNORMAL HIGH (ref <150)
VLDL: 34 mg/dL — AB (ref <30)

## 2016-07-05 LAB — COMPREHENSIVE METABOLIC PANEL
ALT: 12 U/L (ref 6–29)
AST: 12 U/L (ref 10–35)
Albumin: 4.1 g/dL (ref 3.6–5.1)
Alkaline Phosphatase: 66 U/L (ref 33–115)
BUN: 10 mg/dL (ref 7–25)
CHLORIDE: 105 mmol/L (ref 98–110)
CO2: 24 mmol/L (ref 20–31)
CREATININE: 0.79 mg/dL (ref 0.50–1.10)
Calcium: 9.1 mg/dL (ref 8.6–10.2)
Glucose, Bld: 92 mg/dL (ref 65–99)
Potassium: 4 mmol/L (ref 3.5–5.3)
SODIUM: 137 mmol/L (ref 135–146)
Total Bilirubin: 0.4 mg/dL (ref 0.2–1.2)
Total Protein: 6.7 g/dL (ref 6.1–8.1)

## 2016-07-05 LAB — WET PREP FOR TRICH, YEAST, CLUE
TRICH WET PREP: NONE SEEN
Yeast Wet Prep HPF POC: NONE SEEN

## 2016-07-05 MED ORDER — NORETHIN-ETH ESTRAD-FE BIPHAS 1 MG-10 MCG / 10 MCG PO TABS: 1.0000 | ORAL_TABLET | Freq: Every day | ORAL | 4 refills | Status: DC

## 2016-07-05 MED ORDER — METRONIDAZOLE 500 MG PO TABS: 500.0000 mg | ORAL_TABLET | Freq: Two times a day (BID) | ORAL | 0 refills | Status: DC

## 2016-07-05 NOTE — Patient Instructions (Signed)
Health Maintenance, Female Adopting a healthy lifestyle and getting preventive care can go a long way to promote health and wellness. Talk with your health care provider about what schedule of regular examinations is right for you. This is a good chance for you to check in with your provider about disease prevention and staying healthy. In between checkups, there are plenty of things you can do on your own. Experts have done a lot of research about which lifestyle changes and preventive measures are most likely to keep you healthy. Ask your health care provider for more information. Weight and diet Eat a healthy diet  Be sure to include plenty of vegetables, fruits, low-fat dairy products, and lean protein.  Do not eat a lot of foods high in solid fats, added sugars, or salt.  Get regular exercise. This is one of the most important things you can do for your health.  Most adults should exercise for at least 150 minutes each week. The exercise should increase your heart rate and make you sweat (moderate-intensity exercise).  Most adults should also do strengthening exercises at least twice a week. This is in addition to the moderate-intensity exercise. Maintain a healthy weight  Body mass index (BMI) is a measurement that can be used to identify possible weight problems. It estimates body fat based on height and weight. Your health care provider can help determine your BMI and help you achieve or maintain a healthy weight.  For females 76 years of age and older:  A BMI below 18.5 is considered underweight.  A BMI of 18.5 to 24.9 is normal.  A BMI of 25 to 29.9 is considered overweight.  A BMI of 30 and above is considered obese. Watch levels of cholesterol and blood lipids  You should start having your blood tested for lipids and cholesterol at 47 years of age, then have this test every 5 years.  You may need to have your cholesterol levels checked more often if:  Your lipid or  cholesterol levels are high.  You are older than 47 years of age.  You are at high risk for heart disease. Cancer screening Lung Cancer  Lung cancer screening is recommended for adults 64-42 years old who are at high risk for lung cancer because of a history of smoking.  A yearly low-dose CT scan of the lungs is recommended for people who:  Currently smoke.  Have quit within the past 15 years.  Have at least a 30-pack-year history of smoking. A pack year is smoking an average of one pack of cigarettes a day for 1 year.  Yearly screening should continue until it has been 15 years since you quit.  Yearly screening should stop if you develop a health problem that would prevent you from having lung cancer treatment. Breast Cancer  Practice breast self-awareness. This means understanding how your breasts normally appear and feel.  It also means doing regular breast self-exams. Let your health care provider know about any changes, no matter how small.  If you are in your 20s or 30s, you should have a clinical breast exam (CBE) by a health care provider every 1-3 years as part of a regular health exam.  If you are 34 or older, have a CBE every year. Also consider having a breast X-ray (mammogram) every year.  If you have a family history of breast cancer, talk to your health care provider about genetic screening.  If you are at high risk for breast cancer, talk  to your health care provider about having an MRI and a mammogram every year.  Breast cancer gene (BRCA) assessment is recommended for women who have family members with BRCA-related cancers. BRCA-related cancers include:  Breast.  Ovarian.  Tubal.  Peritoneal cancers.  Results of the assessment will determine the need for genetic counseling and BRCA1 and BRCA2 testing. Cervical Cancer  Your health care provider may recommend that you be screened regularly for cancer of the pelvic organs (ovaries, uterus, and vagina).  This screening involves a pelvic examination, including checking for microscopic changes to the surface of your cervix (Pap test). You may be encouraged to have this screening done every 3 years, beginning at age 24.  For women ages 66-65, health care providers may recommend pelvic exams and Pap testing every 3 years, or they may recommend the Pap and pelvic exam, combined with testing for human papilloma virus (HPV), every 5 years. Some types of HPV increase your risk of cervical cancer. Testing for HPV may also be done on women of any age with unclear Pap test results.  Other health care providers may not recommend any screening for nonpregnant women who are considered low risk for pelvic cancer and who do not have symptoms. Ask your health care provider if a screening pelvic exam is right for you.  If you have had past treatment for cervical cancer or a condition that could lead to cancer, you need Pap tests and screening for cancer for at least 20 years after your treatment. If Pap tests have been discontinued, your risk factors (such as having a new sexual partner) need to be reassessed to determine if screening should resume. Some women have medical problems that increase the chance of getting cervical cancer. In these cases, your health care provider may recommend more frequent screening and Pap tests. Colorectal Cancer  This type of cancer can be detected and often prevented.  Routine colorectal cancer screening usually begins at 47 years of age and continues through 47 years of age.  Your health care provider may recommend screening at an earlier age if you have risk factors for colon cancer.  Your health care provider may also recommend using home test kits to check for hidden blood in the stool.  A small camera at the end of a tube can be used to examine your colon directly (sigmoidoscopy or colonoscopy). This is done to check for the earliest forms of colorectal cancer.  Routine  screening usually begins at age 41.  Direct examination of the colon should be repeated every 5-10 years through 47 years of age. However, you may need to be screened more often if early forms of precancerous polyps or small growths are found. Skin Cancer  Check your skin from head to toe regularly.  Tell your health care provider about any new moles or changes in moles, especially if there is a change in a mole's shape or color.  Also tell your health care provider if you have a mole that is larger than the size of a pencil eraser.  Always use sunscreen. Apply sunscreen liberally and repeatedly throughout the day.  Protect yourself by wearing long sleeves, pants, a wide-brimmed hat, and sunglasses whenever you are outside. Heart disease, diabetes, and high blood pressure  High blood pressure causes heart disease and increases the risk of stroke. High blood pressure is more likely to develop in:  People who have blood pressure in the high end of the normal range (130-139/85-89 mm Hg).  People who are overweight or obese.  People who are African American.  If you are 59-24 years of age, have your blood pressure checked every 3-5 years. If you are 34 years of age or older, have your blood pressure checked every year. You should have your blood pressure measured twice-once when you are at a hospital or clinic, and once when you are not at a hospital or clinic. Record the average of the two measurements. To check your blood pressure when you are not at a hospital or clinic, you can use:  An automated blood pressure machine at a pharmacy.  A home blood pressure monitor.  If you are between 29 years and 60 years old, ask your health care provider if you should take aspirin to prevent strokes.  Have regular diabetes screenings. This involves taking a blood sample to check your fasting blood sugar level.  If you are at a normal weight and have a low risk for diabetes, have this test once  every three years after 47 years of age.  If you are overweight and have a high risk for diabetes, consider being tested at a younger age or more often. Preventing infection Hepatitis B  If you have a higher risk for hepatitis B, you should be screened for this virus. You are considered at high risk for hepatitis B if:  You were born in a country where hepatitis B is common. Ask your health care provider which countries are considered high risk.  Your parents were born in a high-risk country, and you have not been immunized against hepatitis B (hepatitis B vaccine).  You have HIV or AIDS.  You use needles to inject street drugs.  You live with someone who has hepatitis B.  You have had sex with someone who has hepatitis B.  You get hemodialysis treatment.  You take certain medicines for conditions, including cancer, organ transplantation, and autoimmune conditions. Hepatitis C  Blood testing is recommended for:  Everyone born from [birth year] through 1966.  Anyone with known risk factors for hepatitis C. Sexually transmitted infections (STIs)  You should be screened for sexually transmitted infections (STIs) including gonorrhea and chlamydia if:  You are sexually active and are younger than 47 years of age.  You are older than 47 years of age and your health care provider tells you that you are at risk for this type of infection.  Your sexual activity has changed since you were last screened and you are at an increased risk for chlamydia or gonorrhea. Ask your health care provider if you are at risk.  If you do not have HIV, but are at risk, it may be recommended that you take a prescription medicine daily to prevent HIV infection. This is called pre-exposure prophylaxis (PrEP). You are considered at risk if:  You are sexually active and do not regularly use condoms or know the HIV status of your partner(s).  You take drugs by injection.  You are sexually active with a partner  who has HIV. Talk with your health care provider about whether you are at high risk of being infected with HIV. If you choose to begin PrEP, you should first be tested for HIV. You should then be tested every 3 months for as long as you are taking PrEP. Pregnancy  If you are premenopausal and you may become pregnant, ask your health care provider about preconception counseling.  If you may become pregnant, take 400 to 800 micrograms (mcg) of folic acid  every day.  If you want to prevent pregnancy, talk to your health care provider about birth control (contraception). Osteoporosis and menopause  Osteoporosis is a disease in which the bones lose minerals and strength with aging. This can result in serious bone fractures. Your risk for osteoporosis can be identified using a bone density scan.  If you are 65 years of age or older, or if you are at risk for osteoporosis and fractures, ask your health care provider if you should be screened.  Ask your health care provider whether you should take a calcium or vitamin D supplement to lower your risk for osteoporosis.  Menopause may have certain physical symptoms and risks.  Hormone replacement therapy may reduce some of these symptoms and risks. Talk to your health care provider about whether hormone replacement therapy is right for you. Follow these instructions at home:  Schedule regular health, dental, and eye exams.  Stay current with your immunizations.  Do not use any tobacco products including cigarettes, chewing tobacco, or electronic cigarettes.  If you are pregnant, do not drink alcohol.  If you are breastfeeding, limit how much and how often you drink alcohol.  Limit alcohol intake to no more than 1 drink per day for nonpregnant women. One drink equals 12 ounces of beer, 5 ounces of wine, or 1 ounces of hard liquor.  Do not use street drugs.  Do not share needles.  Ask your health care provider for help if you need support  or information about quitting drugs.  Tell your health care provider if you often feel depressed.  Tell your health care provider if you have ever been abused or do not feel safe at home. This information is not intended to replace advice given to you by your health care provider. Make sure you discuss any questions you have with your health care provider. Document Released: 09/27/2010 Document Revised: 08/20/2015 Document Reviewed: 12/16/2014 Elsevier Interactive Patient Education  2017 [employer]  Bacterial Vaginosis Bacterial vaginosis is an infection of the vagina. It happens when too many germs (bacteria) grow in the vagina. This infection puts you at risk for infections from sex (STIs). Treating this infection can lower your risk for some STIs. You should also treat this if you are pregnant. It can cause your baby to be born early. Follow these instructions at home: Medicines   Take over-the-counter and prescription medicines only as told by your doctor.  Take or use your antibiotic medicine as told by your doctor. Do not stop taking or using it even if you start to feel better. General instructions   If you your sexual partner is a woman, tell her that you have this infection. She needs to get treatment if she has symptoms. If you have a female partner, he does not need to be treated.  During treatment:  Avoid sex.  Do not douche.  Avoid alcohol as told.  Avoid breastfeeding as told.  Drink enough fluid to keep your pee (urine) clear or pale yellow.  Keep your vagina and butt (rectum) clean.  Wash the area with warm water every day.  Wipe from front to back after you use the toilet.  Keep all follow-up visits as told by your doctor. This is important. Preventing this condition   Do not douche.  Use only warm water to wash around your vagina.  Use protection when you have sex. This includes:  Latex condoms.  Dental dams.  Limit how many people you have sex    with. It is best to only have sex with the same person (be monogamous).  Get tested for STIs. Have your partner get tested.  Wear underwear that is cotton or lined with cotton.  Avoid tight pants and pantyhose. This is most important in summer.  Do not use any products that have nicotine or tobacco in them. These include cigarettes and e-cigarettes. If you need help quitting, ask your doctor.  Do not use illegal drugs.  Limit how much alcohol you drink. Contact a doctor if:  Your symptoms do not get better, even after you are treated.  You have more discharge or pain when you pee (urinate).  You have a fever.  You have pain in your belly (abdomen).  You have pain with sex.  Your bleed from your vagina between periods. Summary  This infection happens when too many germs (bacteria) grow in the vagina.  Treating this condition can lower your risk for some infections from sex (STIs).  You should also treat this if you are pregnant. It can cause early (premature) birth.  Do not stop taking or using your antibiotic medicine even if you start to feel better. This information is not intended to replace advice given to you by your health care provider. Make sure you discuss any questions you have with your health care provider. Document Released: 12/22/2007 Document Revised: 11/28/2015 Document Reviewed: 11/28/2015 Elsevier Interactive Patient Education  2017 [employer] Inc.  

## 2016-07-05 NOTE — Progress Notes (Signed)
Courtney Bowman [birth year] [id number]    History:    Presents for annual exam.  Irregular bleeding on Micronor, had return to normal blood pressures, changed to lo loestrin has had no further problems with irregular bleeding.  Has lost 10 pounds in the past year.  Same partner,  requested GC/Chlamydia screen. 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 2014 ascus with positive high risk HPV -16, 18 and 45. 2015, 2016 Paps normal, 2017 Pap normal with negative HR HPV. History of anemia. 2017 tummy tuck. Normal mammograms, last mammogram had follow-up negative ultrasound. 11/2014 benign endometrial  polyp.  Past medical history, past surgical history, family history and social history were all reviewed and documented in the EPIC chart. Works for an [employer]. [facility] state doing well. Shea [place] 15 doing well. Father died from multiple myeloma. Mother healthy.  ROS:  A ROS was performed and pertinent positives and negatives are included.  Exam:  Vitals:   07/05/16 1527  BP: 134/82  Weight: 182 lb (82.6 kg)  Height: '5\' 7"'  (1.702 m)   Body mass index is 28.51 kg/m.   General appearance:  Normal Thyroid:  Symmetrical, normal in size, without palpable masses or nodularity. Respiratory  Auscultation:  Clear without wheezing or rhonchi Cardiovascular  Auscultation:  Regular rate, without rubs, murmurs or gallops  Edema/varicosities:  Not grossly evident Abdominal  Soft,nontender, without masses, guarding or rebound.  Liver/spleen:  No organomegaly noted  Hernia:  None appreciated  Skin  Inspection:  Grossly normal   Breasts: Examined lying and sitting.     Right: Without masses, retractions, discharge or axillary adenopathy.     Left: Without masses, retractions, discharge or axillary adenopathy. Gentitourinary   Inguinal/mons:  Normal without inguinal adenopathy  External genitalia:  Normal  BUS/Urethra/Skene's glands:  Normal  Vagina:   Normal  Cervix:  Normal  Uterus:  normal in size, shape and contour.  Midline and mobile  Adnexa/parametria:     Rt: Without masses or tenderness.   Lt: Without masses or tenderness.  Anus and perineum: Normal  Digital rectal exam: Normal sphincter tone without palpated masses or tenderness  Assessment/Plan:  46 y.o. SBF G3 P2  for annual exam no complaints.  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 Encouraged to continue monitoring blood pressure greater than 130/80 instructed to call. Flagyl 500 twice daily for 7 days, alcohol precautions reviewed. Congratulated on weight loss, continue healthy lifestyle of lower carb diet and increased exercise. SBE's, continue annual 3-D screening mammogram. Vitamin D 2000 daily encouraged. CBC, CMP, lipid panel, GC/Chlamydia culture per request, declines need for HIV, hepatitis or RPR.    Huel Cote [facility], 4:39 PM 07/05/2016

## 2016-07-06 LAB — URINALYSIS W MICROSCOPIC + REFLEX CULTURE
BILIRUBIN URINE: NEGATIVE
Casts: NONE SEEN [LPF]
Glucose, UA: NEGATIVE
Hgb urine dipstick: NEGATIVE
Nitrite: NEGATIVE
PH: 6 (ref 5.0–8.0)
Protein, ur: NEGATIVE
Specific Gravity, Urine: 1.03 (ref 1.001–1.035)
Yeast: NONE SEEN [HPF]

## 2016-07-06 LAB — GC/CHLAMYDIA PROBE AMP
CT Probe RNA: NOT DETECTED
GC Probe RNA: NOT DETECTED

## 2016-07-07 ENCOUNTER — Other Ambulatory Visit: Payer: Self-pay | Admitting: Women's Health

## 2016-07-07 DIAGNOSIS — D500 Iron deficiency anemia secondary to blood loss (chronic): Secondary | ICD-10-CM

## 2016-07-07 LAB — URINE CULTURE

## 2016-07-08 LAB — PAP IG W/ RFLX HPV ASCU

## 2016-08-10 ENCOUNTER — Encounter: Payer: Self-pay | Admitting: Gynecology

## 2016-10-06 ENCOUNTER — Other Ambulatory Visit: Payer: Self-pay | Admitting: Women's Health

## 2016-10-06 DIAGNOSIS — Z1231 Encounter for screening mammogram for malignant neoplasm of breast: Secondary | ICD-10-CM

## 2016-10-26 ENCOUNTER — Other Ambulatory Visit: Payer: Self-pay

## 2016-10-26 ENCOUNTER — Ambulatory Visit: Payer: 59

## 2016-10-27 ENCOUNTER — Ambulatory Visit
Admission: RE | Admit: 2016-10-27 | Discharge: 2016-10-27 | Disposition: A | Discharge status: Home / Self Care | Payer: 59 | Source: Ambulatory Visit | Attending: Women's Health | Admitting: Women's Health

## 2016-10-27 ENCOUNTER — Ambulatory Visit: Payer: 59

## 2016-10-27 DIAGNOSIS — Z1231 Encounter for screening mammogram for malignant neoplasm of breast: Secondary | ICD-10-CM

## 2017-08-16 ENCOUNTER — Ambulatory Visit (INDEPENDENT_AMBULATORY_CARE_PROVIDER_SITE_OTHER): Payer: 59 | Admitting: Women's Health

## 2017-08-16 ENCOUNTER — Encounter: Payer: Self-pay | Admitting: Women's Health

## 2017-08-16 VITALS — BP 120/74 | Ht 67.0 in | Wt 191.0 lb

## 2017-08-16 DIAGNOSIS — Z3041 Encounter for surveillance of contraceptive pills: Secondary | ICD-10-CM | POA: Diagnosis not present

## 2017-08-16 DIAGNOSIS — Z01419 Encounter for gynecological examination (general) (routine) without abnormal findings: Secondary | ICD-10-CM | POA: Diagnosis not present

## 2017-08-16 DIAGNOSIS — Z113 Encounter for screening for infections with a predominantly sexual mode of transmission: Secondary | ICD-10-CM | POA: Diagnosis not present

## 2017-08-16 DIAGNOSIS — Z1151 Encounter for screening for human papillomavirus (HPV): Secondary | ICD-10-CM

## 2017-08-16 DIAGNOSIS — Z13220 Encounter for screening for lipoid disorders: Secondary | ICD-10-CM | POA: Diagnosis not present

## 2017-08-16 DIAGNOSIS — Z23 Encounter for immunization: Secondary | ICD-10-CM | POA: Diagnosis not present

## 2017-08-16 LAB — CBC MORPHOLOGY

## 2017-08-16 LAB — CBC WITH DIFFERENTIAL/PLATELET
BASOS PCT: 0.6 %
Basophils Absolute: 50 cells/uL (ref 0–200)
EOS PCT: 4.1 %
Eosinophils Absolute: 344 cells/uL (ref 15–500)
HEMATOCRIT: 30.6 % — AB (ref 35.0–45.0)
Hemoglobin: 9 g/dL — ABNORMAL LOW (ref 11.7–15.5)
LYMPHS ABS: 2100 {cells}/uL (ref 850–3900)
MCH: 19.5 pg — ABNORMAL LOW (ref 27.0–33.0)
MCHC: 29.4 g/dL — ABNORMAL LOW (ref 32.0–36.0)
MCV: 66.4 fL — AB (ref 80.0–100.0)
MPV: 10.4 fL (ref 7.5–12.5)
Monocytes Relative: 7.1 %
Neutro Abs: 5309 cells/uL (ref 1500–7800)
Neutrophils Relative %: 63.2 %
PLATELETS: 432 10*3/uL — AB (ref 140–400)
RBC: 4.61 10*6/uL (ref 3.80–5.10)
RDW: 18 % — ABNORMAL HIGH (ref 11.0–15.0)
TOTAL LYMPHOCYTE: 25 %
WBC: 8.4 10*3/uL (ref 3.8–10.8)
WBCMIX: 596 {cells}/uL (ref 200–950)

## 2017-08-16 LAB — LIPID PANEL
CHOL/HDL RATIO: 3.2 (calc) (ref <5.0)
CHOLESTEROL: 149 mg/dL (ref <200)
HDL: 46 mg/dL — AB (ref >50)
LDL CHOLESTEROL (CALC): 81 mg/dL
Non-HDL Cholesterol (Calc): 103 mg/dL (calc) (ref <130)
Triglycerides: 128 mg/dL (ref <150)

## 2017-08-16 LAB — COMPREHENSIVE METABOLIC PANEL
AG Ratio: 1.5 (calc) (ref 1.0–2.5)
ALBUMIN MSPROF: 4.1 g/dL (ref 3.6–5.1)
ALT: 13 U/L (ref 6–29)
AST: 13 U/L (ref 10–35)
Alkaline phosphatase (APISO): 64 U/L (ref 33–115)
BUN: 11 mg/dL (ref 7–25)
CALCIUM: 9.6 mg/dL (ref 8.6–10.2)
CO2: 25 mmol/L (ref 20–32)
CREATININE: 0.66 mg/dL (ref 0.50–1.10)
Chloride: 104 mmol/L (ref 98–110)
GLOBULIN: 2.8 g/dL (ref 1.9–3.7)
Glucose, Bld: 86 mg/dL (ref 65–99)
POTASSIUM: 4.4 mmol/L (ref 3.5–5.3)
SODIUM: 135 mmol/L (ref 135–146)
TOTAL PROTEIN: 6.9 g/dL (ref 6.1–8.1)
Total Bilirubin: 0.6 mg/dL (ref 0.2–1.2)

## 2017-08-16 MED ORDER — PHENTERMINE HCL 37.5 MG PO CAPS: 37.5000 mg | ORAL_CAPSULE | ORAL | 0 refills | Status: DC

## 2017-08-16 MED ORDER — NORETHIN-ETH ESTRAD-FE BIPHAS 1 MG-10 MCG / 10 MCG PO TABS: 1.0000 | ORAL_TABLET | Freq: Every day | ORAL | 4 refills | Status: DC

## 2017-08-16 NOTE — Addendum Note (Signed)
Addended by: Nelva Nay on: 08/16/2017 12:53 PM   Modules accepted: Orders

## 2017-08-16 NOTE — Addendum Note (Signed)
Addended by: Nelva Nay on: 08/16/2017 12:56 PM   Modules accepted: Orders

## 2017-08-16 NOTE — Patient Instructions (Signed)
Tdap done  Health Maintenance, Female Adopting a healthy lifestyle and getting preventive care can go a long way to promote health and wellness. Talk with your health care provider about what schedule of regular examinations is right for you. This is a good chance for you to check in with your provider about disease prevention and staying healthy. In between checkups, there are plenty of things you can do on your own. Experts have done a lot of research about which lifestyle changes and preventive measures are most likely to keep you healthy. Ask your health care provider for more information. Weight and diet Eat a healthy diet  Be sure to include plenty of vegetables, fruits, low-fat dairy products, and lean protein.  Do not eat a lot of foods high in solid fats, added sugars, or salt.  Get regular exercise. This is one of the most important things you can do for your health. ? Most adults should exercise for at least 150 minutes each week. The exercise should increase your heart rate and make you sweat (moderate-intensity exercise). ? Most adults should also do strengthening exercises at least twice a week. This is in addition to the moderate-intensity exercise.  Maintain a healthy weight  Body mass index (BMI) is a measurement that can be used to identify possible weight problems. It estimates body fat based on height and weight. Your health care provider can help determine your BMI and help you achieve or maintain a healthy weight.  For females 26 years of age and older: ? A BMI below 18.5 is considered underweight. ? A BMI of 18.5 to 24.9 is normal. ? A BMI of 25 to 29.9 is considered overweight. ? A BMI of 30 and above is considered obese.  Watch levels of cholesterol and blood lipids  You should start having your blood tested for lipids and cholesterol at 48 years of age, then have this test every 5 years.  You may need to have your cholesterol levels checked more often  if: ? Your lipid or cholesterol levels are high. ? You are older than 48 years of age. ? You are at high risk for heart disease.  Cancer screening Lung Cancer  Lung cancer screening is recommended for adults 66-64 years old who are at high risk for lung cancer because of a history of smoking.  A yearly low-dose CT scan of the lungs is recommended for people who: ? Currently smoke. ? Have quit within the past 15 years. ? Have at least a 30-pack-year history of smoking. A pack year is smoking an average of one pack of cigarettes a day for 1 year.  Yearly screening should continue until it has been 15 years since you quit.  Yearly screening should stop if you develop a health problem that would prevent you from having lung cancer treatment.  Breast Cancer  Practice breast self-awareness. This means understanding how your breasts normally appear and feel.  It also means doing regular breast self-exams. Let your health care provider know about any changes, no matter how small.  If you are in your 20s or 30s, you should have a clinical breast exam (CBE) by a health care provider every 1-3 years as part of a regular health exam.  If you are 79 or older, have a CBE every year. Also consider having a breast X-ray (mammogram) every year.  If you have a family history of breast cancer, talk to your health care provider about genetic screening.  If you are  at high risk for breast cancer, talk to your health care provider about having an MRI and a mammogram every year.  Breast cancer gene (BRCA) assessment is recommended for women who have family members with BRCA-related cancers. BRCA-related cancers include: ? Breast. ? Ovarian. ? Tubal. ? Peritoneal cancers.  Results of the assessment will determine the need for genetic counseling and BRCA1 and BRCA2 testing.  Cervical Cancer Your health care provider may recommend that you be screened regularly for cancer of the pelvic organs  (ovaries, uterus, and vagina). This screening involves a pelvic examination, including checking for microscopic changes to the surface of your cervix (Pap test). You may be encouraged to have this screening done every 3 years, beginning at age 74.  For women ages 66-65, health care providers may recommend pelvic exams and Pap testing every 3 years, or they may recommend the Pap and pelvic exam, combined with testing for human papilloma virus (HPV), every 5 years. Some types of HPV increase your risk of cervical cancer. Testing for HPV may also be done on women of any age with unclear Pap test results.  Other health care providers may not recommend any screening for nonpregnant women who are considered low risk for pelvic cancer and who do not have symptoms. Ask your health care provider if a screening pelvic exam is right for you.  If you have had past treatment for cervical cancer or a condition that could lead to cancer, you need Pap tests and screening for cancer for at least 20 years after your treatment. If Pap tests have been discontinued, your risk factors (such as having a new sexual partner) need to be reassessed to determine if screening should resume. Some women have medical problems that increase the chance of getting cervical cancer. In these cases, your health care provider may recommend more frequent screening and Pap tests.  Colorectal Cancer  This type of cancer can be detected and often prevented.  Routine colorectal cancer screening usually begins at 48 years of age and continues through 48 years of age.  Your health care provider may recommend screening at an earlier age if you have risk factors for colon cancer.  Your health care provider may also recommend using home test kits to check for hidden blood in the stool.  A small camera at the end of a tube can be used to examine your colon directly (sigmoidoscopy or colonoscopy). This is done to check for the earliest forms of  colorectal cancer.  Routine screening usually begins at age 38.  Direct examination of the colon should be repeated every 5-10 years through 48 years of age. However, you may need to be screened more often if early forms of precancerous polyps or small growths are found.  Skin Cancer  Check your skin from head to toe regularly.  Tell your health care provider about any new moles or changes in moles, especially if there is a change in a mole's shape or color.  Also tell your health care provider if you have a mole that is larger than the size of a pencil eraser.  Always use sunscreen. Apply sunscreen liberally and repeatedly throughout the day.  Protect yourself by wearing long sleeves, pants, a wide-brimmed hat, and sunglasses whenever you are outside.  Heart disease, diabetes, and high blood pressure  High blood pressure causes heart disease and increases the risk of stroke. High blood pressure is more likely to develop in: ? People who have blood pressure in the  in the high end of the normal range (130-139/85-89 mm Hg). ? People who are overweight or obese. ? People who are African American.  If you are 18-39 years of age, have your blood pressure checked every 3-5 years. If you are 40 years of age or older, have your blood pressure checked every year. You should have your blood pressure measured twice-once when you are at a hospital or clinic, and once when you are not at a hospital or clinic. Record the average of the two measurements. To check your blood pressure when you are not at a hospital or clinic, you can use: ? An automated blood pressure machine at a pharmacy. ? A home blood pressure monitor.  If you are between 55 years and 79 years old, ask your health care provider if you should take aspirin to prevent strokes.  Have regular diabetes screenings. This involves taking a blood sample to check your fasting blood sugar level. ? If you are at a normal weight and have a low risk  for diabetes, have this test once every three years after 48 years of age. ? If you are overweight and have a high risk for diabetes, consider being tested at a younger age or more often. Preventing infection Hepatitis B  If you have a higher risk for hepatitis B, you should be screened for this virus. You are considered at high risk for hepatitis B if: ? You were born in a country where hepatitis B is common. Ask your health care provider which countries are considered high risk. ? Your parents were born in a high-risk country, and you have not been immunized against hepatitis B (hepatitis B vaccine). ? You have HIV or AIDS. ? You use needles to inject street drugs. ? You live with someone who has hepatitis B. ? You have had sex with someone who has hepatitis B. ? You get hemodialysis treatment. ? You take certain medicines for conditions, including cancer, organ transplantation, and autoimmune conditions.  Hepatitis C  Blood testing is recommended for: ? Everyone born from 1945 through 1965. ? Anyone with known risk factors for hepatitis C.  Sexually transmitted infections (STIs)  You should be screened for sexually transmitted infections (STIs) including gonorrhea and chlamydia if: ? You are sexually active and are younger than 48 years of age. ? You are older than 48 years of age and your health care provider tells you that you are at risk for this type of infection. ? Your sexual activity has changed since you were last screened and you are at an increased risk for chlamydia or gonorrhea. Ask your health care provider if you are at risk.  If you do not have HIV, but are at risk, it may be recommended that you take a prescription medicine daily to prevent HIV infection. This is called pre-exposure prophylaxis (PrEP). You are considered at risk if: ? You are sexually active and do not regularly use condoms or know the HIV status of your partner(s). ? You take drugs by  injection. ? You are sexually active with a partner who has HIV.  Talk with your health care provider about whether you are at high risk of being infected with HIV. If you choose to begin PrEP, you should first be tested for HIV. You should then be tested every 3 months for as long as you are taking PrEP. Pregnancy  If you are premenopausal and you may become pregnant, ask your health care provider about preconception counseling.    you may become pregnant, take 400 to 800 micrograms (mcg) of folic acid every day.  If you want to prevent pregnancy, talk to your health care provider about birth control (contraception). Osteoporosis and menopause  Osteoporosis is a disease in which the bones lose minerals and strength with aging. This can result in serious bone fractures. Your risk for osteoporosis can be identified using a bone density scan.  If you are 47 years of age or older, or if you are at risk for osteoporosis and fractures, ask your health care provider if you should be screened.  Ask your health care provider whether you should take a calcium or vitamin D supplement to lower your risk for osteoporosis.  Menopause may have certain physical symptoms and risks.  Hormone replacement therapy may reduce some of these symptoms and risks. Talk to your health care provider about whether hormone replacement therapy is right for you. Follow these instructions at home:  Schedule regular health, dental, and eye exams.  Stay current with your immunizations.  Do not use any tobacco products including cigarettes, chewing tobacco, or electronic cigarettes.  If you are pregnant, do not drink alcohol.  If you are breastfeeding, limit how much and how often you drink alcohol.  Limit alcohol intake to no more than 1 drink per day for nonpregnant women. One drink equals 12 ounces of beer, 5 ounces of wine, or 1 ounces of hard liquor.  Do not use street drugs.  Do not share needles.  Ask  your health care provider for help if you need support or information about quitting drugs.  Tell your health care provider if you often feel depressed.  Tell your health care provider if you have ever been abused or do not feel safe at home. This information is not intended to replace advice given to you by your health care provider. Make sure you discuss any questions you have with your health care provider. Document Released: 09/27/2010 Document Revised: 08/20/2015 Document Reviewed: 12/16/2014 Elsevier Interactive Patient Education  [employer].

## 2017-08-16 NOTE — Progress Notes (Signed)
Courtney Bowman Jul 31, 1970 [id number]    History:    Presents for annual exam.   monthly cycle on lo Loestrin without complaint.  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  Normal mammogram history. Same partner requesting cultures only negative screen in the past..  Requesting 1 month prescription of phentermine to hopefully jump start weight loss.  Past medical history, past surgical history, family history and social history were all reviewed and documented in the EPIC chart.  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  2017 tummy tuck.    ROS:  A ROS was performed and pertinent positives and negatives are included.  Exam:  Vitals:   08/16/17 1203  BP: 120/74  Weight: 191 lb (86.6 kg)  Height: 5\' 7"  (1.702 m)   Body mass index is 29.91 kg/m.   General appearance:  Normal Thyroid:  Symmetrical, normal in size, without palpable masses or nodularity. Respiratory  Auscultation:  Clear without wheezing or rhonchi Cardiovascular  Auscultation:  Regular rate, without rubs, murmurs or gallops  Edema/varicosities:  Not grossly evident Abdominal  Soft,nontender, without masses, guarding or rebound.  Liver/spleen:  No organomegaly noted  Hernia:  None appreciated  Skin  Inspection:  Grossly normal   Breasts: Examined lying and sitting.     Right: Without masses, retractions, discharge or axillary adenopathy.     Left: Without masses, retractions, discharge or axillary adenopathy. Gentitourinary   Inguinal/mons:  Normal without inguinal adenopathy  External genitalia:  Normal  BUS/Urethra/Skene's glands:  Normal  Vagina:  Normal  Cervix:  Normal  Uterus:   normal in size, shape and contour.  Midline and mobile  Adnexa/parametria:     Rt: Without masses or tenderness.   Lt: Without masses or tenderness.  Anus and perineum: Normal  Digital rectal exam: Normal sphincter tone without palpated  masses or tenderness  Assessment/Plan:  47 y.o. DBF G3, P2 for annual exam.    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  Weight watchers encouraged.  SBE's, continue annual screening mammogram, calcium rich foods, vitamin D 2000 daily encouraged.  Lo Loestrin prescription, proper use given and reviewed slight risk for blood clots and strokes.  CBC, CMP, lipid panel, GC/chlamydia, Pap with HR HPV typing, new screening guidelines reviewed.    [place], 12:39 PM 08/16/2017

## 2017-08-17 ENCOUNTER — Telehealth: Payer: Self-pay | Admitting: *Deleted

## 2017-08-17 LAB — C. TRACHOMATIS/N. GONORRHOEAE RNA
C. TRACHOMATIS RNA, TMA: NOT DETECTED
N. GONORRHOEAE RNA, TMA: NOT DETECTED

## 2017-08-17 LAB — PAP, TP IMAGING W/ HPV RNA, RFLX HPV TYPE 16,18/45: HPV DNA High Risk: NOT DETECTED

## 2017-08-17 NOTE — Telephone Encounter (Signed)
Prior authorization done via cover my meds for phentermine 37.5 mg tablet will wait for response.

## 2017-08-23 NOTE — Telephone Encounter (Signed)
Rx denied patient has not any of the following additional risk factors: such as diabetes, impaired glucose tolerance, hypertension, CAD. Etc.

## 2017-09-18 ENCOUNTER — Other Ambulatory Visit: Payer: Self-pay | Admitting: Women's Health

## 2017-10-02 ENCOUNTER — Other Ambulatory Visit: Payer: Self-pay | Admitting: Women's Health

## 2017-10-02 DIAGNOSIS — Z1231 Encounter for screening mammogram for malignant neoplasm of breast: Secondary | ICD-10-CM

## 2017-10-16 ENCOUNTER — Telehealth: Payer: Self-pay | Admitting: *Deleted

## 2017-10-16 NOTE — Telephone Encounter (Signed)
Please call, cannot renew prescription, blood pressure issues can occur with this medication, encouraged her to have a great time on vacation, may be try to eat breakfast, skip lunch or have something very light, and eat  early dinner.  Congratulate her on the weight loss that she has had and continue to watch portions.

## 2017-10-16 NOTE — Telephone Encounter (Signed)
Patient informed with the below note.

## 2017-10-16 NOTE — Telephone Encounter (Signed)
Patient called was prescribed phentermine 37.5 mg tablet #30 day supply only. Your note clearly states this, she called today requesting another #30 day supply states she has lost some weight, will be leaving cruise. Please advise

## 2017-11-01 ENCOUNTER — Ambulatory Visit
Admission: RE | Admit: 2017-11-01 | Discharge: 2017-11-01 | Disposition: A | Discharge status: Home / Self Care | Payer: 59 | Source: Ambulatory Visit | Attending: Women's Health | Admitting: Women's Health

## 2017-11-01 DIAGNOSIS — Z1231 Encounter for screening mammogram for malignant neoplasm of breast: Secondary | ICD-10-CM

## 2018-06-04 ENCOUNTER — Ambulatory Visit (INDEPENDENT_AMBULATORY_CARE_PROVIDER_SITE_OTHER): Payer: 59 | Admitting: Podiatry

## 2018-06-04 ENCOUNTER — Encounter: Payer: Self-pay | Admitting: Podiatry

## 2018-06-04 VITALS — BP 154/102

## 2018-06-04 DIAGNOSIS — L600 Ingrowing nail: Secondary | ICD-10-CM | POA: Diagnosis not present

## 2018-06-04 DIAGNOSIS — B351 Tinea unguium: Secondary | ICD-10-CM

## 2018-06-04 MED ORDER — HYDROCODONE-ACETAMINOPHEN 10-325 MG PO TABS: 1.0000 | ORAL_TABLET | Freq: Three times a day (TID) | ORAL | 0 refills | Status: DC | PRN

## 2018-06-04 MED ORDER — NEOMYCIN-POLYMYXIN-HC 3.5-10000-1 OT SOLN: OTIC | 0 refills | Status: DC

## 2018-06-04 NOTE — Progress Notes (Signed)
Subjective:   Patient ID: Courtney Bowman, female   DOB: 48 y.o.   MRN: [id number]   HPI Patient presents with painful ingrown toenail deformity of the medial lateral sides of both big toes of years duration and recent discoloration of the hallux nail left over right that she has only had one other time.  She is concerned about fungus and also is concerned about her chronic ingrown toenail.  Patient does not smoke and likes to be active   Review of Systems  All other systems reviewed and are negative.       Objective:  Physical Exam Vitals signs and nursing note reviewed.  Constitutional:      Appearance: She is well-developed.  Pulmonary:     Effort: Pulmonary effort is normal.  Musculoskeletal: Normal range of motion.  Skin:    General: Skin is warm.  Neurological:     Mental Status: She is alert.     Neurovascular status found to be intact muscle strength is adequate range of motion within normal limits with patient found to have incurvated medial lateral borders of the hallux bilateral that are painful when pressed.  Patient is also noted to have discoloration of the distal two thirds of the bed on the lateral side with the left being worse than the right with slight lifting of the nailbed noted.  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  At this time I educated the patient on the problems she has and the chronic nature of ingrown toenail deformity.  I do think removal of the nail borders is necessary due to her long-term nature and at the same time I can do pathology and she wants to undergo this treatment.  At this point I did go ahead and I  infiltrated each hallux 60 mg Xylocaine Marcaine mixture sterile prep applied to each big toe and using sterile instrumentation I remove the medial lateral borders exposed matrix and applied phenol 3 applications 30 seconds followed by alcohol by sterile dressing.  For the lateral border left hallux I did send this off for fungal evaluation and we will await those results and decide if antifungal therapy might be appropriate.  Reappoint 3 weeks and instructed to call with any questions concerns prior and did write for drops to use for the postoperative.

## 2018-06-04 NOTE — Patient Instructions (Signed)

## 2018-06-27 ENCOUNTER — Ambulatory Visit (INDEPENDENT_AMBULATORY_CARE_PROVIDER_SITE_OTHER): Payer: 59 | Admitting: Podiatry

## 2018-06-27 ENCOUNTER — Encounter: Payer: Self-pay | Admitting: Podiatry

## 2018-06-27 ENCOUNTER — Other Ambulatory Visit: Payer: Self-pay

## 2018-06-27 ENCOUNTER — Encounter

## 2018-06-27 VITALS — Temp 98.1°F

## 2018-06-27 DIAGNOSIS — L600 Ingrowing nail: Secondary | ICD-10-CM

## 2018-06-28 NOTE — Progress Notes (Signed)
Subjective:   Patient ID: Courtney Bowman, female   DOB: 48 y.o.   MRN: [id number]   HPI Patient presents stating that she was concerned about some looseness of her left big toenail and some discoloration and crusted tissue formation but states overall she is healing fine   ROS      Objective:  Physical Exam  Neurovascular status intact with patient found to have negative fungal culture but does have discoloration of her nailbeds which are most likely chronic trauma to the underlying nails     Assessment:  Do not think there is any issue as far as healing goes but unfortunate this patient does have nail damage which is occurred left over right     Plan:  H&P conditions reviewed and recommended the continuation of soaks at the current time along with padding which I dispensed today.  Patient may lose the nail and someday a more aggressive procedure may be necessary but will get a hold off currently

## 2018-08-28 ENCOUNTER — Other Ambulatory Visit: Payer: Self-pay

## 2018-08-29 ENCOUNTER — Ambulatory Visit (INDEPENDENT_AMBULATORY_CARE_PROVIDER_SITE_OTHER): Payer: 59 | Admitting: Women's Health

## 2018-08-29 ENCOUNTER — Encounter: Payer: Self-pay | Admitting: Women's Health

## 2018-08-29 VITALS — BP 132/82 | Ht 67.0 in | Wt 200.0 lb

## 2018-08-29 DIAGNOSIS — Z13220 Encounter for screening for lipoid disorders: Secondary | ICD-10-CM | POA: Diagnosis not present

## 2018-08-29 DIAGNOSIS — Z01419 Encounter for gynecological examination (general) (routine) without abnormal findings: Secondary | ICD-10-CM | POA: Diagnosis not present

## 2018-08-29 MED ORDER — NORETHIN ACE-ETH ESTRAD-FE 1-20 MG-MCG PO TABS: 1.0000 | ORAL_TABLET | Freq: Every day | ORAL | 4 refills | Status: DC

## 2018-08-29 NOTE — Progress Notes (Signed)
Courtney Bowman [birth year] [id number]    History:    Presents for annual exam.  Monthly cycle on lo Loestrin without complaint, stopped this month due to having 2 cycles monthly and currently not sexually active.  Denies need for STD screen.  Normal mammogram history.  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  Pap normal negative high risk HPV 2019.  10 pound weight gain.  Past medical history, past surgical history, family history and social history were all reviewed and documented in the EPIC chart.  Daughter doing well has a baby who is a year old, son at [employer], [employer].  Desk job..  ROS:  A ROS was performed and pertinent positives and negatives are included.  Exam:  Vitals:   08/29/18 1543  BP: 132/82  Weight: 200 lb (90.7 kg)  Height: 5\' 7"  (1.702 m)   Body mass index is 31.32 kg/m.   General appearance:  Normal Thyroid:  Symmetrical, normal in size, without palpable masses or nodularity. Respiratory  Auscultation:  Clear without wheezing or rhonchi Cardiovascular  Auscultation:  Regular rate, without rubs, murmurs or gallops  Edema/varicosities:  Not grossly evident Abdominal history of a tummy tuck  Soft,nontender, without masses, guarding or rebound.  Liver/spleen:  No organomegaly noted  Hernia:  None appreciated  Skin  Inspection:  Grossly normal   Breasts: Examined lying and sitting.     Right: Without masses, retractions, discharge or axillary adenopathy.     Left: Without masses, retractions, discharge or axillary adenopathy. Gentitourinary   Inguinal/mons:  Normal without inguinal adenopathy  External genitalia:  Normal  BUS/Urethra/Skene's glands:  Normal  Vagina:  Normal  Cervix:  Normal  Uterus:   normal in size, shape and contour.  Midline and mobile  Adnexa/parametria:     Rt: Without masses or tenderness.   Lt: Without masses or tenderness.  Anus and perineum: Normal  Digital rectal exam: Normal sphincter tone without palpated masses  or tenderness  Assessment/Plan:  48 y.o. SBF G3, P2 for annual exam.    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  Start first day of next cycle, instructed to call if 2 cycles monthly, condoms encouraged if sexually active.  SBEs, continue annual screening mammogram, calcium rich foods, vitamin D 2000 daily encouraged.  Encouraged increase regular exercise, decrease calorie/carbs.  CBC, TSH, lipid panel, Pap, Pap normal with negative HR HPV 2019,   [place], 3:47 PM 08/29/2018

## 2018-08-29 NOTE — Patient Instructions (Addendum)
Screening colonoscopy [facility]  Dr Carlean Purl  [place] Maintenance, Female Adopting a healthy lifestyle and getting preventive care can go a long way to promote health and wellness. Talk with your health care provider about what schedule of regular examinations is right for you. This is a good chance for you to check in with your provider about disease prevention and staying healthy. In between checkups, there are plenty of things you can do on your own. Experts have done a lot of research about which lifestyle changes and preventive measures are most likely to keep you healthy. Ask your health care provider for more information. Weight and diet Eat a healthy diet  Be sure to include plenty of vegetables, fruits, low-fat dairy products, and lean protein.  Do not eat a lot of foods high in solid fats, added sugars, or salt.  Get regular exercise. This is one of the most important things you can do for your health. ? Most adults should exercise for at least 150 minutes each week. The exercise should increase your heart rate and make you sweat (moderate-intensity exercise). ? Most adults should also do strengthening exercises at least twice a week. This is in addition to the moderate-intensity exercise. Maintain a healthy weight  Body mass index (BMI) is a measurement that can be used to identify possible weight problems. It estimates body fat based on height and weight. Your health care provider can help determine your BMI and help you achieve or maintain a healthy weight.  For females 54 years of age and older: ? A BMI below 18.5 is considered underweight. ? A BMI of 18.5 to 24.9 is normal. ? A BMI of 25 to 29.9 is considered overweight. ? A BMI of 30 and above is considered obese. Watch levels of cholesterol and blood lipids  You should start having your blood tested for lipids and cholesterol at 49 years of age, then have this test every 5 years.  You may need to have your  cholesterol levels checked more often if: ? Your lipid or cholesterol levels are high. ? You are older than 49 years of age. ? You are at high risk for heart disease. Cancer screening Lung Cancer  Lung cancer screening is recommended for adults 16-79 years old who are at high risk for lung cancer because of a history of smoking.  A yearly low-dose CT scan of the lungs is recommended for people who: ? Currently smoke. ? Have quit within the past 15 years. ? Have at least a 30-pack-year history of smoking. A pack year is smoking an average of one pack of cigarettes a day for 1 year.  Yearly screening should continue until it has been 15 years since you quit.  Yearly screening should stop if you develop a health problem that would prevent you from having lung cancer treatment. Breast Cancer  Practice breast self-awareness. This means understanding how your breasts normally appear and feel.  It also means doing regular breast self-exams. Let your health care provider know about any changes, no matter how small.  If you are in your 20s or 30s, you should have a clinical breast exam (CBE) by a health care provider every 1-3 years as part of a regular health exam.  If you are 57 or older, have a CBE every year. Also consider having a breast X-ray (mammogram) every year.  If you have a family history of breast cancer, talk to your health care provider about genetic screening.  If  If you are at high risk for breast cancer, talk to your health care provider about having an MRI and a mammogram every year.  Breast cancer gene (BRCA) assessment is recommended for women who have family members with BRCA-related cancers. BRCA-related cancers include: ? Breast. ? Ovarian. ? Tubal. ? Peritoneal cancers.  Results of the assessment will determine the need for genetic counseling and BRCA1 and BRCA2 testing. Cervical Cancer Your health care provider may recommend that you be screened regularly for  cancer of the pelvic organs (ovaries, uterus, and vagina). This screening involves a pelvic examination, including checking for microscopic changes to the surface of your cervix (Pap test). You may be encouraged to have this screening done every 3 years, beginning at age 21.  For women ages 30-65, health care providers may recommend pelvic exams and Pap testing every 3 years, or they may recommend the Pap and pelvic exam, combined with testing for human papilloma virus (HPV), every 5 years. Some types of HPV increase your risk of cervical cancer. Testing for HPV may also be done on women of any age with unclear Pap test results.  Other health care providers may not recommend any screening for nonpregnant women who are considered low risk for pelvic cancer and who do not have symptoms. Ask your health care provider if a screening pelvic exam is right for you.  If you have had past treatment for cervical cancer or a condition that could lead to cancer, you need Pap tests and screening for cancer for at least 20 years after your treatment. If Pap tests have been discontinued, your risk factors (such as having a new sexual partner) need to be reassessed to determine if screening should resume. Some women have medical problems that increase the chance of getting cervical cancer. In these cases, your health care provider may recommend more frequent screening and Pap tests. Colorectal Cancer  This type of cancer can be detected and often prevented.  Routine colorectal cancer screening usually begins at 50 years of age and continues through 49 years of age.  Your health care provider may recommend screening at an earlier age if you have risk factors for colon cancer.  Your health care provider may also recommend using home test kits to check for hidden blood in the stool.  A small camera at the end of a tube can be used to examine your colon directly (sigmoidoscopy or colonoscopy). This is done to check for  the earliest forms of colorectal cancer.  Routine screening usually begins at age 50.  Direct examination of the colon should be repeated every 5-10 years through 49 years of age. However, you may need to be screened more often if early forms of precancerous polyps or small growths are found. Skin Cancer  Check your skin from head to toe regularly.  Tell your health care provider about any new moles or changes in moles, especially if there is a change in a mole's shape or color.  Also tell your health care provider if you have a mole that is larger than the size of a pencil eraser.  Always use sunscreen. Apply sunscreen liberally and repeatedly throughout the day.  Protect yourself by wearing long sleeves, pants, a wide-brimmed hat, and sunglasses whenever you are outside. Heart disease, diabetes, and high blood pressure  High blood pressure causes heart disease and increases the risk of stroke. High blood pressure is more likely to develop in: ? People who have blood pressure in the high   end of the normal range (130-139/85-89 mm Hg). ? People who are overweight or obese. ? People who are African American.  If you are 18-39 years of age, have your blood pressure checked every 3-5 years. If you are 40 years of age or older, have your blood pressure checked every year. You should have your blood pressure measured twice-once when you are at a hospital or clinic, and once when you are not at a hospital or clinic. Record the average of the two measurements. To check your blood pressure when you are not at a hospital or clinic, you can use: ? An automated blood pressure machine at a pharmacy. ? A home blood pressure monitor.  If you are between 55 years and 79 years old, ask your health care provider if you should take aspirin to prevent strokes.  Have regular diabetes screenings. This involves taking a blood sample to check your fasting blood sugar level. ? If you are at a normal weight and  have a low risk for diabetes, have this test once every three years after 49 years of age. ? If you are overweight and have a high risk for diabetes, consider being tested at a younger age or more often. Preventing infection Hepatitis B  If you have a higher risk for hepatitis B, you should be screened for this virus. You are considered at high risk for hepatitis B if: ? You were born in a country where hepatitis B is common. Ask your health care provider which countries are considered high risk. ? Your parents were born in a high-risk country, and you have not been immunized against hepatitis B (hepatitis B vaccine). ? You have HIV or AIDS. ? You use needles to inject street drugs. ? You live with someone who has hepatitis B. ? You have had sex with someone who has hepatitis B. ? You get hemodialysis treatment. ? You take certain medicines for conditions, including cancer, organ transplantation, and autoimmune conditions. Hepatitis C  Blood testing is recommended for: ? Everyone born from 1945 through 1965. ? Anyone with known risk factors for hepatitis C. Sexually transmitted infections (STIs)  You should be screened for sexually transmitted infections (STIs) including gonorrhea and chlamydia if: ? You are sexually active and are younger than 49 years of age. ? You are older than 49 years of age and your health care provider tells you that you are at risk for this type of infection. ? Your sexual activity has changed since you were last screened and you are at an increased risk for chlamydia or gonorrhea. Ask your health care provider if you are at risk.  If you do not have HIV, but are at risk, it may be recommended that you take a prescription medicine daily to prevent HIV infection. This is called pre-exposure prophylaxis (PrEP). You are considered at risk if: ? You are sexually active and do not regularly use condoms or know the HIV status of your partner(s). ? You take drugs by  injection. ? You are sexually active with a partner who has HIV. Talk with your health care provider about whether you are at high risk of being infected with HIV. If you choose to begin PrEP, you should first be tested for HIV. You should then be tested every 3 months for as long as you are taking PrEP. Pregnancy  If you are premenopausal and you may become pregnant, ask your health care provider about preconception counseling.  If you may become pregnant,   take 400 to 800 micrograms (mcg) of folic acid every day.  If you want to prevent pregnancy, talk to your health care provider about birth control (contraception). Osteoporosis and menopause  Osteoporosis is a disease in which the bones lose minerals and strength with aging. This can result in serious bone fractures. Your risk for osteoporosis can be identified using a bone density scan.  If you are 65 years of age or older, or if you are at risk for osteoporosis and fractures, ask your health care provider if you should be screened.  Ask your health care provider whether you should take a calcium or vitamin D supplement to lower your risk for osteoporosis.  Menopause may have certain physical symptoms and risks.  Hormone replacement therapy may reduce some of these symptoms and risks. Talk to your health care provider about whether hormone replacement therapy is right for you. Follow these instructions at home:  Schedule regular health, dental, and eye exams.  Stay current with your immunizations.  Do not use any tobacco products including cigarettes, chewing tobacco, or electronic cigarettes.  If you are pregnant, do not drink alcohol.  If you are breastfeeding, limit how much and how often you drink alcohol.  Limit alcohol intake to no more than 1 drink per day for nonpregnant women. One drink equals 12 ounces of beer, 5 ounces of wine, or 1 ounces of hard liquor.  Do not use street drugs.  Do not share needles.  Ask  your health care provider for help if you need support or information about quitting drugs.  Tell your health care provider if you often feel depressed.  Tell your health care provider if you have ever been abused or do not feel safe at home. This information is not intended to replace advice given to you by your health care provider. Make sure you discuss any questions you have with your health care provider. Document Released: 09/27/2010 Document Revised: 08/20/2015 Document Reviewed: 12/16/2014 Elsevier Interactive Patient Education  2019 [employer]  

## 2018-08-30 LAB — CBC WITH DIFFERENTIAL/PLATELET
Absolute Monocytes: 604 cells/uL (ref 200–950)
Basophils Absolute: 64 cells/uL (ref 0–200)
Basophils Relative: 0.6 %
Eosinophils Absolute: 371 cells/uL (ref 15–500)
Eosinophils Relative: 3.5 %
HCT: 35.8 % (ref 35.0–45.0)
Hemoglobin: 11.4 g/dL — ABNORMAL LOW (ref 11.7–15.5)
Lymphs Abs: 2671 cells/uL (ref 850–3900)
MCH: 24.7 pg — ABNORMAL LOW (ref 27.0–33.0)
MCHC: 31.8 g/dL — ABNORMAL LOW (ref 32.0–36.0)
MCV: 77.7 fL — ABNORMAL LOW (ref 80.0–100.0)
MPV: 10.3 fL (ref 7.5–12.5)
Monocytes Relative: 5.7 %
Neutro Abs: 6890 cells/uL (ref 1500–7800)
Neutrophils Relative %: 65 %
Platelets: 295 10*3/uL (ref 140–400)
RBC: 4.61 10*6/uL (ref 3.80–5.10)
RDW: 15.7 % — ABNORMAL HIGH (ref 11.0–15.0)
Total Lymphocyte: 25.2 %
WBC: 10.6 10*3/uL (ref 3.8–10.8)

## 2018-08-30 LAB — LIPID PANEL
Cholesterol: 159 mg/dL (ref <200)
HDL: 52 mg/dL (ref >=50)
LDL Cholesterol (Calc): 83 mg/dL (calc)
Non-HDL Cholesterol (Calc): 107 mg/dL (calc) (ref <130)
Total CHOL/HDL Ratio: 3.1 (calc) (ref <5.0)
Triglycerides: 143 mg/dL (ref <150)

## 2018-08-30 LAB — COMPREHENSIVE METABOLIC PANEL
AG Ratio: 1.5 (calc) (ref 1.0–2.5)
ALT: 15 U/L (ref 6–29)
AST: 12 U/L (ref 10–35)
Albumin: 4 g/dL (ref 3.6–5.1)
Alkaline phosphatase (APISO): 72 U/L (ref 31–125)
BUN: 10 mg/dL (ref 7–25)
CO2: 25 mmol/L (ref 20–32)
Calcium: 9.9 mg/dL (ref 8.6–10.2)
Chloride: 103 mmol/L (ref 98–110)
Creat: 0.65 mg/dL (ref 0.50–1.10)
Globulin: 2.7 g/dL (calc) (ref 1.9–3.7)
Glucose, Bld: 87 mg/dL (ref 65–99)
Potassium: 4 mmol/L (ref 3.5–5.3)
Sodium: 136 mmol/L (ref 135–146)
Total Bilirubin: 0.4 mg/dL (ref 0.2–1.2)
Total Protein: 6.7 g/dL (ref 6.1–8.1)

## 2018-08-30 LAB — PAP IG W/ RFLX HPV ASCU

## 2018-09-26 ENCOUNTER — Other Ambulatory Visit: Payer: Self-pay | Admitting: Women's Health

## 2018-09-26 DIAGNOSIS — Z1231 Encounter for screening mammogram for malignant neoplasm of breast: Secondary | ICD-10-CM

## 2018-11-05 ENCOUNTER — Ambulatory Visit
Admission: RE | Admit: 2018-11-05 | Discharge: 2018-11-05 | Disposition: A | Discharge status: Home / Self Care | Payer: 59 | Source: Ambulatory Visit | Attending: Women's Health | Admitting: Women's Health

## 2018-11-05 ENCOUNTER — Other Ambulatory Visit: Payer: Self-pay

## 2018-11-05 DIAGNOSIS — Z1231 Encounter for screening mammogram for malignant neoplasm of breast: Secondary | ICD-10-CM

## 2019-03-05 ENCOUNTER — Telehealth: Payer: Self-pay | Admitting: Podiatry

## 2019-03-05 NOTE — Telephone Encounter (Signed)
Patient came into the office to make a payment on her account today, but then mentioned that she wanted to speak with Dr Paulla Dolly who did her surgery. Didn't tell me what it was regarding, because she seemed to be in a rush to leave after she made her payment. Told her I would give the message to his nurse.

## 2019-03-05 NOTE — Telephone Encounter (Signed)
I called Courtney Bowman and she states she is not satisfied with the service she has received from Dr. Paulla Dolly, she states she was told at her visit she would not see a problem or outgrowth of the toenail for over a year and she is having a lot of problems with the left toe on the right hand-side. Courtney Bowman states she has left numerous messages when she comes into pay and no one has contacted her. Courtney Bowman states she knows something is wrong and she doesn't feel she should have to pay because she feels she was forced, convinced to have the procedure at her 1st visit and she started to have pain and problems about 2 months ago. I told Courtney Bowman I would inform our office manager and call again.

## 2019-03-06 NOTE — Telephone Encounter (Signed)
Pt called states once again no one has called her about her complaint. I spoke with B. Bonkemeyer - Glass blower/designer when she came in from meeting and she stated she would contact pt tonight or tomorrow after discussing with Dr. Paulla Dolly.

## 2019-03-06 NOTE — Telephone Encounter (Signed)
I called pt and informed of B. Bonkemeyer's message.

## 2019-03-07 ENCOUNTER — Ambulatory Visit (INDEPENDENT_AMBULATORY_CARE_PROVIDER_SITE_OTHER): Payer: 59 | Admitting: Podiatry

## 2019-03-07 ENCOUNTER — Other Ambulatory Visit: Payer: Self-pay

## 2019-03-07 DIAGNOSIS — L600 Ingrowing nail: Secondary | ICD-10-CM

## 2019-03-07 MED ORDER — HYDROCODONE-ACETAMINOPHEN 10-325 MG PO TABS: 1.0000 | ORAL_TABLET | Freq: Three times a day (TID) | ORAL | 0 refills | Status: AC | PRN

## 2019-03-07 MED ORDER — NEOMYCIN-POLYMYXIN-HC 3.5-10000-1 OT SOLN: OTIC | 1 refills | Status: DC

## 2019-03-07 NOTE — Patient Instructions (Signed)

## 2019-03-07 NOTE — Telephone Encounter (Addendum)
I spoke with B. Bonkemeyer - office manager, she stated Dr. Paulla Dolly would like to evaluate pt and would do so without charge. I informed pt of Dr. Mellody Drown request and transferred to schedulers and routed message to schedulers.

## 2019-03-08 NOTE — Progress Notes (Signed)
Subjective:   Patient ID: Courtney Bowman, female   DOB: 48 y.o.   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  Give instructions on soaks and this should solve the problem and will be seen back to recheck

## 2019-09-04 ENCOUNTER — Encounter: Payer: 59 | Admitting: Nurse Practitioner

## 2019-09-17 ENCOUNTER — Encounter: Payer: 59 | Admitting: Nurse Practitioner

## 2019-10-07 ENCOUNTER — Other Ambulatory Visit: Payer: Self-pay

## 2019-10-09 ENCOUNTER — Ambulatory Visit (INDEPENDENT_AMBULATORY_CARE_PROVIDER_SITE_OTHER): Payer: 59 | Admitting: Nurse Practitioner

## 2019-10-09 ENCOUNTER — Other Ambulatory Visit: Payer: Self-pay

## 2019-10-09 ENCOUNTER — Telehealth: Payer: Self-pay | Admitting: *Deleted

## 2019-10-09 ENCOUNTER — Encounter: Payer: Self-pay | Admitting: Nurse Practitioner

## 2019-10-09 VITALS — BP 124/83 | Ht 68.0 in | Wt 210.8 lb

## 2019-10-09 DIAGNOSIS — Z3041 Encounter for surveillance of contraceptive pills: Secondary | ICD-10-CM | POA: Diagnosis not present

## 2019-10-09 DIAGNOSIS — Z713 Dietary counseling and surveillance: Secondary | ICD-10-CM | POA: Diagnosis not present

## 2019-10-09 DIAGNOSIS — Z113 Encounter for screening for infections with a predominantly sexual mode of transmission: Secondary | ICD-10-CM

## 2019-10-09 DIAGNOSIS — Z01419 Encounter for gynecological examination (general) (routine) without abnormal findings: Secondary | ICD-10-CM

## 2019-10-09 DIAGNOSIS — Z01411 Encounter for gynecological examination (general) (routine) with abnormal findings: Secondary | ICD-10-CM | POA: Diagnosis not present

## 2019-10-09 DIAGNOSIS — Z13220 Encounter for screening for lipoid disorders: Secondary | ICD-10-CM | POA: Diagnosis not present

## 2019-10-09 DIAGNOSIS — N6315 Unspecified lump in the right breast, overlapping quadrants: Secondary | ICD-10-CM

## 2019-10-09 MED ORDER — PHENTERMINE HCL 30 MG PO CAPS: 30.0000 mg | ORAL_CAPSULE | ORAL | 0 refills | Status: DC

## 2019-10-09 MED ORDER — NORETHIN ACE-ETH ESTRAD-FE 1-20 MG-MCG PO TABS: 1.0000 | ORAL_TABLET | Freq: Every day | ORAL | 3 refills | Status: DC

## 2019-10-09 NOTE — Progress Notes (Addendum)
Courtney Bowman 1970-07-04 [id number]   History:  49 y.o. [id number] presents for annual exam. 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 2016 endometrial polyp removed. Regular monthly cycles on Loestrin Fe. Has been struggling with weight loss. Was on phentermine in the past and did well with this and would like to try to 1-2 months. Sexually active occasionally. Requests HIV/RPR today.   Gynecologic History Patient's last menstrual period was 10/05/2019. Period Duration (Days): 5 DAYS Period Pattern: Regular Menstrual Flow: Moderate, Light Menstrual Control: Thin pad, Maxi pad Menstrual Control Change Freq (Hours): 3 TIMES PER DAY Dysmenorrhea: (!) Mild Dysmenorrhea Symptoms: Cramping Contraception: OCP (estrogen/progesterone) Last Pap: 08/29/2018. Results were: normal Last mammogram: 11/06/2018. Results were: normal Last colonoscopy: never  Past medical history, past surgical history, family history and social history were all reviewed and documented in the EPIC chart.  ROS:  A ROS was performed and pertinent positives and negatives are included.  Exam:  Vitals:   10/09/19 0916  BP: 124/83  Weight: 210 lb 12.8 oz (95.6 kg)  Height: 5\' 8"  (1.727 m)   Body mass index is 32.05 kg/m.  General appearance:  Normal Thyroid:  Symmetrical, normal in size, without palpable masses or nodularity. Respiratory  Auscultation:  Clear without wheezing or rhonchi Cardiovascular  Auscultation:  Regular rate, without rubs, murmurs or gallops  Edema/varicosities:  Not grossly evident Abdominal  Soft,nontender, without masses, guarding or rebound.  Liver/spleen:  No organomegaly noted  Hernia:  None appreciated  Skin  Inspection:  Grossly normal   Breasts: Examined lying and sitting.   Right: Firm, fixated mass at 12 o'clock, non-tender. No retractions, discharge or axillary adenopathy.   Left: Without masses, retractions, discharge or axillary  adenopathy. Gentitourinary   Inguinal/mons:  Normal without inguinal adenopathy  External genitalia:  Normal  BUS/Urethra/Skene's glands:  Normal  Vagina:  Normal  Cervix:  Normal  Uterus:  Anteverted, normal in size, shape and contour.  Midline and mobile  Adnexa/parametria:     Rt: Without masses or tenderness.   Lt: Without masses or tenderness.  Anus and perineum: Normal  Digital rectal exam: Normal sphincter tone without palpated masses or tenderness  Assessment/Plan:  49 y.o. [name] for annual exam.     Well female exam with routine gynecological exam - Plan: CBC with Differential/Platelet, Comprehensive metabolic panel, PAP,TP IMGw/HPV RNA,rflx HPVTYPE16,18/45. Education provided on SBEs, importance of preventative screenings, current guidelines, high calcium diet, regular exercise, and multivitamin daily.   Encounter for weight loss counseling - Plan: phentermine 30 MG capsule. Discussed low calorie/low carb diet and regular exercise to maintain calorie deficit. She is aware phentermine is a controlled medication and for short-term use only. She will follow up in 1 month. Recommended she lose 5% of her weight (around 10lb) by then.   Right breast lump 12 o'clock- felt on exam. Recommended diagnostic mammogram but patient declines. She plans to schedule her screening mammogram soon and does not want the extra cost. She is aware of risks and knows that if it is found to be suspicious they will order diagnostic ultrasound. She reports history of benign lumps in the past that come and go.   Lipid screening - Plan: Lipid panel  Screen for STD (sexually transmitted disease) - Plan: HIV Antibody (routine testing w rflx), RPR  Encounter for surveillance of contraceptive pills - Plan: norethindrone-ethinyl estradiol (LOESTRIN FE) 1-20 MG-MCG tablet. Refill x 1 year provided.  Screening for colon cancer - Discussed  current guidelines and recommendations. Information provided on [facility].  She wants to start next year.   Follow up in 1 month for weight check and 1 year for annual.      [name], 9:17 AM 10/09/2019

## 2019-10-09 NOTE — Patient Instructions (Signed)
Schedule colonoscopy! [facility] [phone] [address] [place], [place] [address]   Health Maintenance, Female Adopting a healthy lifestyle and getting preventive care are important in promoting health and wellness. Ask your health care provider about:  The right schedule for you to have regular tests and exams.  Things you can do on your own to prevent diseases and keep yourself healthy. What should I know about diet, weight, and exercise? Eat a healthy diet   Eat a diet that includes plenty of vegetables, fruits, low-fat dairy products, and lean protein.  Do not eat a lot of foods that are high in solid fats, added sugars, or sodium. Maintain a healthy weight Body mass index (BMI) is used to identify weight problems. It estimates body fat based on height and weight. Your health care provider can help determine your BMI and help you achieve or maintain a healthy weight. Get regular exercise Get regular exercise. This is one of the most important things you can do for your health. Most adults should:  Exercise for at least 150 minutes each week. The exercise should increase your heart rate and make you sweat (moderate-intensity exercise).  Do strengthening exercises at least twice a week. This is in addition to the moderate-intensity exercise.  Spend less time sitting. Even light physical activity can be beneficial. Watch cholesterol and blood lipids Have your blood tested for lipids and cholesterol at 50 years of age, then have this test every 5 years. Have your cholesterol levels checked more often if:  Your lipid or cholesterol levels are high.  You are older than 50 years of age.  You are at high risk for heart disease. What should I know about cancer screening? Depending on your health history and family history, you may need to have cancer screening at various ages. This may include screening for:  Breast cancer.  Cervical cancer.  Colorectal cancer.  Skin  cancer.  Lung cancer. What should I know about heart disease, diabetes, and high blood pressure? Blood pressure and heart disease  High blood pressure causes heart disease and increases the risk of stroke. This is more likely to develop in people who have high blood pressure readings, are of African descent, or are overweight.  Have your blood pressure checked: ? Every 3-5 years if you are 18-39 years of age. ? Every year if you are 40 years old or older. Diabetes Have regular diabetes screenings. This checks your fasting blood sugar level. Have the screening done:  Once every three years after age 40 if you are at a normal weight and have a low risk for diabetes.  More often and at a younger age if you are overweight or have a high risk for diabetes. What should I know about preventing infection? Hepatitis B If you have a higher risk for hepatitis B, you should be screened for this virus. Talk with your health care provider to find out if you are at risk for hepatitis B infection. Hepatitis C Testing is recommended for:  Everyone born from 1945 through 1965.  Anyone with known risk factors for hepatitis C. Sexually transmitted infections (STIs)  Get screened for STIs, including gonorrhea and chlamydia, if: ? You are sexually active and are younger than 50 years of age. ? You are older than 50 years of age and your health care provider tells you that you are at risk for this type of infection. ? Your sexual activity has changed since you were last screened, and   you are at increased risk for chlamydia or gonorrhea. Ask your health care provider if you are at risk.  Ask your health care provider about whether you are at high risk for HIV. Your health care provider may recommend a prescription medicine to help prevent HIV infection. If you choose to take medicine to prevent HIV, you should first get tested for HIV. You should then be tested every 3 months for as long as you are taking  the medicine. Pregnancy  If you are about to stop having your period (premenopausal) and you may become pregnant, seek counseling before you get pregnant.  Take 400 to 800 micrograms (mcg) of folic acid every day if you become pregnant.  Ask for birth control (contraception) if you want to prevent pregnancy. Osteoporosis and menopause Osteoporosis is a disease in which the bones lose minerals and strength with aging. This can result in bone fractures. If you are 65 years old or older, or if you are at risk for osteoporosis and fractures, ask your health care provider if you should:  Be screened for bone loss.  Take a calcium or vitamin D supplement to lower your risk of fractures.  Be given hormone replacement therapy (HRT) to treat symptoms of menopause. Follow these instructions at home: Lifestyle  Do not use any products that contain nicotine or tobacco, such as cigarettes, e-cigarettes, and chewing tobacco. If you need help quitting, ask your health care provider.  Do not use street drugs.  Do not share needles.  Ask your health care provider for help if you need support or information about quitting drugs. Alcohol use  Do not drink alcohol if: ? Your health care provider tells you not to drink. ? You are pregnant, may be pregnant, or are planning to become pregnant.  If you drink alcohol: ? Limit how much you use to 0-1 drink a day. ? Limit intake if you are breastfeeding.  Be aware of how much alcohol is in your drink. In the [place] one drink equals one 12 oz bottle of beer (355 mL), one 5 oz glass of wine (148 mL), or one 1 oz glass of hard liquor (44 mL). General instructions  Schedule regular health, dental, and eye exams.  Stay current with your vaccines.  Tell your health care provider if: ? You often feel depressed. ? You have ever been abused or do not feel safe at home. Summary  Adopting a healthy lifestyle and getting preventive care are important in  promoting health and wellness.  Follow your health care provider's instructions about healthy diet, exercising, and getting tested or screened for diseases.  Follow your health care provider's instructions on monitoring your cholesterol and blood pressure. This information is not intended to replace advice given to you by your health care provider. Make sure you discuss any questions you have with your health care provider. Document Revised: 03/07/2018 Document Reviewed: 03/07/2018 Elsevier Patient Education  2020 Elsevier Inc.  

## 2019-10-09 NOTE — Telephone Encounter (Signed)
PA done online for phentermine 30 mg capsules via [facility]. Medication approved "IHDTPN:[id number];TVIFXG:[id number];Review Type:Prior Auth;Coverage Start Date:09/09/2019;Coverage End Date:01/07/2020;   Pharmacy informed as well.

## 2019-10-10 LAB — CBC WITH DIFFERENTIAL/PLATELET
Absolute Monocytes: 560 cells/uL (ref 200–950)
Basophils Absolute: 70 cells/uL (ref 0–200)
Basophils Relative: 0.7 %
Eosinophils Absolute: 170 cells/uL (ref 15–500)
Eosinophils Relative: 1.7 %
HCT: 38.6 % (ref 35.0–45.0)
Hemoglobin: 12.3 g/dL (ref 11.7–15.5)
Lymphs Abs: 2310 cells/uL (ref 850–3900)
MCH: 25.6 pg — ABNORMAL LOW (ref 27.0–33.0)
MCHC: 31.9 g/dL — ABNORMAL LOW (ref 32.0–36.0)
MCV: 80.4 fL (ref 80.0–100.0)
MPV: 10.7 fL (ref 7.5–12.5)
Monocytes Relative: 5.6 %
Neutro Abs: 6890 cells/uL (ref 1500–7800)
Neutrophils Relative %: 68.9 %
Platelets: 258 10*3/uL (ref 140–400)
RBC: 4.8 10*6/uL (ref 3.80–5.10)
RDW: 14.8 % (ref 11.0–15.0)
Total Lymphocyte: 23.1 %
WBC: 10 10*3/uL (ref 3.8–10.8)

## 2019-10-10 LAB — COMPREHENSIVE METABOLIC PANEL
AG Ratio: 1.4 (calc) (ref 1.0–2.5)
ALT: 17 U/L (ref 6–29)
AST: 13 U/L (ref 10–35)
Albumin: 4 g/dL (ref 3.6–5.1)
Alkaline phosphatase (APISO): 70 U/L (ref 37–153)
BUN: 15 mg/dL (ref 7–25)
CO2: 26 mmol/L (ref 20–32)
Calcium: 9.5 mg/dL (ref 8.6–10.4)
Chloride: 105 mmol/L (ref 98–110)
Creat: 0.79 mg/dL (ref 0.50–1.05)
Globulin: 2.8 g/dL (calc) (ref 1.9–3.7)
Glucose, Bld: 108 mg/dL — ABNORMAL HIGH (ref 65–99)
Potassium: 4.3 mmol/L (ref 3.5–5.3)
Sodium: 138 mmol/L (ref 135–146)
Total Bilirubin: 0.6 mg/dL (ref 0.2–1.2)
Total Protein: 6.8 g/dL (ref 6.1–8.1)

## 2019-10-10 LAB — LIPID PANEL
Cholesterol: 145 mg/dL (ref <200)
HDL: 50 mg/dL (ref >=50)
LDL Cholesterol (Calc): 73 mg/dL (calc)
Non-HDL Cholesterol (Calc): 95 mg/dL (calc) (ref <130)
Total CHOL/HDL Ratio: 2.9 (calc) (ref <5.0)
Triglycerides: 138 mg/dL (ref <150)

## 2019-10-10 LAB — HIV ANTIBODY (ROUTINE TESTING W REFLEX): HIV 1&2 Ab, 4th Generation: NONREACTIVE

## 2019-10-10 LAB — RPR: RPR Ser Ql: NONREACTIVE

## 2019-10-11 LAB — PAP, TP IMAGING W/ HPV RNA, RFLX HPV TYPE 16,18/45: HPV DNA High Risk: NOT DETECTED

## 2019-10-15 ENCOUNTER — Other Ambulatory Visit: Payer: Self-pay | Admitting: Nurse Practitioner

## 2019-10-15 ENCOUNTER — Other Ambulatory Visit: Payer: Self-pay | Admitting: Women's Health

## 2019-10-15 DIAGNOSIS — Z1231 Encounter for screening mammogram for malignant neoplasm of breast: Secondary | ICD-10-CM

## 2019-10-17 ENCOUNTER — Telehealth: Payer: Self-pay | Admitting: *Deleted

## 2019-10-17 NOTE — Telephone Encounter (Signed)
Patient called and left message asking for return call to discuss about work. I called and was not able to leave a message due to voicemail being full.

## 2019-11-06 ENCOUNTER — Other Ambulatory Visit: Payer: Self-pay

## 2019-11-06 ENCOUNTER — Ambulatory Visit
Admission: RE | Admit: 2019-11-06 | Discharge: 2019-11-06 | Disposition: A | Discharge status: Home / Self Care | Payer: 59 | Source: Ambulatory Visit | Attending: Nurse Practitioner | Admitting: Nurse Practitioner

## 2019-11-06 DIAGNOSIS — Z1231 Encounter for screening mammogram for malignant neoplasm of breast: Secondary | ICD-10-CM

## 2019-11-20 ENCOUNTER — Other Ambulatory Visit: Payer: Self-pay

## 2019-11-20 ENCOUNTER — Ambulatory Visit (INDEPENDENT_AMBULATORY_CARE_PROVIDER_SITE_OTHER): Payer: 59 | Admitting: Nurse Practitioner

## 2019-11-20 VITALS — BP 140/90 | Wt 208.0 lb

## 2019-11-20 DIAGNOSIS — Z713 Dietary counseling and surveillance: Secondary | ICD-10-CM

## 2019-11-20 NOTE — Progress Notes (Signed)
49 year old here for weight check. Started on Phentermine 30 mg daily on 10/09/2019 with goal of losing 5% of current weight (210lb). Today she weighs 208 lb so we will not continue with treatment. It is recommended she continue a low carb/low calorie diet and increase exercise to maintain a calorie deficit. At her last visit we discussed local weight loss clinics for nutritional support.

## 2020-03-28 HISTORY — PX: OTHER SURGICAL HISTORY: SHX169

## 2020-07-21 ENCOUNTER — Telehealth: Payer: Self-pay | Admitting: *Deleted

## 2020-07-21 ENCOUNTER — Other Ambulatory Visit: Payer: Self-pay | Admitting: Nurse Practitioner

## 2020-07-21 DIAGNOSIS — R5381 Other malaise: Secondary | ICD-10-CM

## 2020-07-21 NOTE — Telephone Encounter (Signed)
I agree with office visit. Thank you for letting me know.

## 2020-07-21 NOTE — Telephone Encounter (Signed)
Patient left voicemail on Triage line with no details.   Returned call to patient. Patient states that she will take her last OCP on Saturday and cycle should start on Sunday. Patient states she has noticed a lump in her left breast. States it is mobile and non tender. Denies fever, rash or nipple discharge. Patient states she thinks it is related to her cycle, but just wants to make sure. Last mammogram WNL on 11-06-19. RN advised OV recommended for further evaluation. Patient agreeable, but requests to schedule for the week after her cycle. OV scheduled for 08-04-20 at 1500. Patient agreeable to date and time of appointment. Patient aware to return call to office to be seen sooner if fever, rash or nipple discharge develops.   Routing to provider and will close encounter.

## 2020-08-04 ENCOUNTER — Ambulatory Visit (INDEPENDENT_AMBULATORY_CARE_PROVIDER_SITE_OTHER): Payer: 59 | Admitting: Nurse Practitioner

## 2020-08-04 ENCOUNTER — Other Ambulatory Visit: Payer: Self-pay

## 2020-08-04 ENCOUNTER — Encounter: Payer: Self-pay | Admitting: Nurse Practitioner

## 2020-08-04 VITALS — BP 128/90 | HR 102 | Resp 16 | Wt 205.0 lb

## 2020-08-04 DIAGNOSIS — N6325 Unspecified lump in the left breast, overlapping quadrants: Secondary | ICD-10-CM

## 2020-08-04 NOTE — Progress Notes (Signed)
   Acute Office Visit  Subjective:    Patient ID: Courtney Bowman, female    DOB: 09-May-1970, 50 y.o.   MRN: [id number]   HPI 50 y.o. presents today for left breast lump. She noticed it a few weeks ago while washing and waited to see if it would go away after cycle but she did not have a cycle in April due to perimenopause. Denies redness, swelling, or nipple discharge.  Normal mammogram history with most recent 11/06/2019. No family history of breast cancer.    Review of Systems  Constitutional: Negative.   Hematological: Negative.  Negative for adenopathy.  Left breast: Positive for mass. Negative for nipple discharge, redness, or swelling     Objective:    Physical Exam Constitutional:      Appearance: Normal appearance. She is obese.  Chest:  Breasts:     Right: Normal.     Left: Mass present. No swelling, inverted nipple, nipple discharge, tenderness or axillary adenopathy.     Lymphadenopathy:     Upper Body:     Left upper body: No axillary adenopathy.     BP 128/90   Pulse (!) 102   Resp 16   Wt 205 lb (93 kg)   BMI 31.17 kg/m  Wt Readings from Last 3 Encounters:  08/04/20 205 lb (93 kg)  11/20/19 208 lb (94.3 kg)  10/09/19 210 lb 12.8 oz (95.6 kg)        Assessment & Plan:   Problem List Items Addressed This Visit   None   Visit Diagnoses    Breast lump on left side at 3 o'clock position    -  Primary     Plan: Likely cyst versus fibroadenoma. Will send referral for breast ultrasound for further evaluation. She is agreeable to plan.      [name] DNP, 3:34 PM 08/04/2020

## 2020-08-05 ENCOUNTER — Telehealth: Payer: Self-pay | Admitting: *Deleted

## 2020-08-05 DIAGNOSIS — N6325 Unspecified lump in the left breast, overlapping quadrants: Secondary | ICD-10-CM

## 2020-08-05 NOTE — Telephone Encounter (Signed)
-----   Message from [name], NP sent at 08/04/2020  3:35 PM EDT ----- Please send referral for left breast ultrasound for mass @ 3 o'clock. Thank you!

## 2020-08-05 NOTE — Telephone Encounter (Signed)
Patient scheduled at the breast center on 09/09/20 @ 9:20am. Patient informed with time and date. Aware she can call daily/weekly for cancellations.

## 2020-08-26 HISTORY — PX: BREAST CYST ASPIRATION: SHX578

## 2020-09-07 ENCOUNTER — Other Ambulatory Visit: Payer: Self-pay | Admitting: Nurse Practitioner

## 2020-09-07 DIAGNOSIS — Z3041 Encounter for surveillance of contraceptive pills: Secondary | ICD-10-CM

## 2020-09-07 NOTE — Telephone Encounter (Signed)
Medication refill request: blisovi fe  Next AEX: none scheduled  Last MMG (if hormonal medication request): 11/06/19 patient has breast diag ordered for 09/09/20 Refill authorized: # 28 with 0 rf pended for today. Note sent to pharmacy for patient to schedule aex for additional refills.

## 2020-09-09 ENCOUNTER — Other Ambulatory Visit: Payer: Self-pay | Admitting: Nurse Practitioner

## 2020-09-09 ENCOUNTER — Other Ambulatory Visit: Payer: Self-pay

## 2020-09-09 ENCOUNTER — Ambulatory Visit
Admission: RE | Admit: 2020-09-09 | Discharge: 2020-09-09 | Disposition: A | Discharge status: Home / Self Care | Payer: 59 | Source: Ambulatory Visit | Attending: Nurse Practitioner | Admitting: Nurse Practitioner

## 2020-09-09 DIAGNOSIS — N6325 Unspecified lump in the left breast, overlapping quadrants: Secondary | ICD-10-CM

## 2020-09-16 ENCOUNTER — Ambulatory Visit
Admission: RE | Admit: 2020-09-16 | Discharge: 2020-09-16 | Disposition: A | Discharge status: Home / Self Care | Payer: 59 | Source: Ambulatory Visit | Attending: Nurse Practitioner | Admitting: Nurse Practitioner

## 2020-09-16 ENCOUNTER — Other Ambulatory Visit: Payer: Self-pay

## 2020-09-16 DIAGNOSIS — N6325 Unspecified lump in the left breast, overlapping quadrants: Secondary | ICD-10-CM

## 2020-09-21 ENCOUNTER — Other Ambulatory Visit: Payer: Self-pay | Admitting: Nurse Practitioner

## 2020-09-21 DIAGNOSIS — Z1231 Encounter for screening mammogram for malignant neoplasm of breast: Secondary | ICD-10-CM

## 2020-10-01 ENCOUNTER — Telehealth: Payer: Self-pay | Admitting: *Deleted

## 2020-10-01 NOTE — Telephone Encounter (Signed)
Patient called stating she is no longer using Blisovi FE 1/20 mcg tablet. Reports she stopped pills on 07/14/20 and told Tiffany she stopped pills at office visit on 08/04/20. She asked I remove pill from chart.

## 2020-11-11 ENCOUNTER — Other Ambulatory Visit: Payer: Self-pay

## 2020-11-11 ENCOUNTER — Ambulatory Visit
Admission: RE | Admit: 2020-11-11 | Discharge: 2020-11-11 | Disposition: A | Discharge status: Home / Self Care | Payer: 59 | Source: Ambulatory Visit | Attending: Nurse Practitioner | Admitting: Nurse Practitioner

## 2020-11-11 DIAGNOSIS — Z1231 Encounter for screening mammogram for malignant neoplasm of breast: Secondary | ICD-10-CM

## 2020-11-12 ENCOUNTER — Ambulatory Visit: Payer: 59

## 2021-02-05 ENCOUNTER — Telehealth: Payer: Self-pay | Admitting: *Deleted

## 2021-02-05 DIAGNOSIS — N926 Irregular menstruation, unspecified: Secondary | ICD-10-CM

## 2021-02-05 MED ORDER — MEGESTROL ACETATE 40 MG PO TABS: 40.0000 mg | ORAL_TABLET | Freq: Two times a day (BID) | ORAL | 1 refills | Status: DC

## 2021-02-05 NOTE — Telephone Encounter (Signed)
Spoke with Dr.Lavoie and she asked me to send in megace 40 mg tablet 1 po bid and schedule pelvic ultrasound. Message sent to appointments to schedule pelvic ultrasound, order placed and also to schedule annual exam with Tiffany. Rx sent. Patient aware of all of this as well.

## 2021-02-05 NOTE — Telephone Encounter (Signed)
Pt called back with questions related to why she is taken megace. Attempted to call pt mailbox full unable to leave message. I will try again to reach pt later.

## 2021-02-05 NOTE — Telephone Encounter (Signed)
Courtney Bowman patient)  Patient called c/o irregular bleeding x 1 month now. Reports starting 01/07/21 her cycle started off light and then became heavy with huge clots., report she even fainted at one point. She had a old Rx of birth control pills that she took, she has been taking pills off and on, now she ran out of the birth control pills. Reports she feels light headed at times. This current cycle started on Sunday and was light, but since Wednesday she has been wearing 2 pads changing every 1hour due to saturating the pad. This type of irregular bleeding is new to patient.  I recommended she go to ER to be seen,however patient declined. Asked if a Rx could be sent to slow the bleeding down until she can get in to see Courtney Bowman?  Please advise

## 2021-02-05 NOTE — Telephone Encounter (Signed)
Ultrasound scheduled on 02/09/21

## 2021-02-08 ENCOUNTER — Telehealth: Payer: Self-pay | Admitting: *Deleted

## 2021-02-08 NOTE — Telephone Encounter (Signed)
-----   Message from Bishop Limbo sent at 02/08/2021  9:15 AM EST ----- Patient states she left a message on Friday about a medicine she was prescribed and didn't hear back yet. Anxious to talk with someone.

## 2021-02-08 NOTE — Telephone Encounter (Signed)
I called patient and spoke patient and explained I left early on Friday 02/05/21, but other staff was here to return the call. Patient said she did not hear back from anyone. Patient said she does not want to take the megace 40 mg tablet reports she saw online it treats cancer patients etc. And does not want her insurance company thinking she has cancer or any other diagnosis. I explained to patient this mediation can also treat heavy bleeding. She took 2 pills and reports the bleeding is lighter and not heavy. Reports she will discuss the megace Rx with Dr.Lavoie at ultrasound appointment tomorrow.

## 2021-02-09 ENCOUNTER — Ambulatory Visit (INDEPENDENT_AMBULATORY_CARE_PROVIDER_SITE_OTHER): Payer: 59

## 2021-02-09 ENCOUNTER — Other Ambulatory Visit: Payer: Self-pay

## 2021-02-09 ENCOUNTER — Telehealth: Payer: Self-pay | Admitting: *Deleted

## 2021-02-09 ENCOUNTER — Encounter: Payer: Self-pay | Admitting: Obstetrics & Gynecology

## 2021-02-09 ENCOUNTER — Ambulatory Visit (INDEPENDENT_AMBULATORY_CARE_PROVIDER_SITE_OTHER): Payer: 59 | Admitting: Obstetrics & Gynecology

## 2021-02-09 VITALS — BP 138/90

## 2021-02-09 DIAGNOSIS — N926 Irregular menstruation, unspecified: Secondary | ICD-10-CM | POA: Diagnosis not present

## 2021-02-09 DIAGNOSIS — D219 Benign neoplasm of connective and other soft tissue, unspecified: Secondary | ICD-10-CM | POA: Diagnosis not present

## 2021-02-09 DIAGNOSIS — N920 Excessive and frequent menstruation with regular cycle: Secondary | ICD-10-CM

## 2021-02-09 DIAGNOSIS — N840 Polyp of corpus uteri: Secondary | ICD-10-CM

## 2021-02-09 NOTE — Telephone Encounter (Signed)
Spoke with patient, she is coming in for a u/s visit and will ask Provider her questions pertaining to Megace.

## 2021-02-09 NOTE — Telephone Encounter (Signed)
-----   Message from [name], MD sent at 02/09/2021 11:18 AM EST ----- Regarding: Schedule Surgery Surgery: Hysteroscopy with Myosure Excision, Dilation and Curettage, Novasure Endometrial Ablation  Diagnosis: Menorrhagia/Probable Endometrial Polyp  Location: [facility]  Status: Outpatient  Time: 30 Minutes  Assistant: N/A  Urgency: First Available  Pre-Op Appointment: Completed  Post-Op Appointment(s): 2 Weeks  Time Out Of Work: Day Of Surgery ONLY

## 2021-02-09 NOTE — Progress Notes (Signed)
    Courtney Bowman 12/15/1970 [id number]        50 y.o.  [id number]   RP: Heavy menses for Pelvic US  HPI: Heavy menses every month with cramps.  No BTB. No pelvic pain outside the periods.  H/O Endometrial Polyp removed in 2016. Stopped BCPs, declined Megace.   OB History  Gravida Para Term Preterm AB Living  3 2     1 2   SAB IAB Ectopic Multiple Live Births               # Outcome Date GA Lbr Len/2nd Weight Sex Delivery Anes PTL Lv  3 AB           2 Para           1 Para             Past medical history,surgical history, problem list, medications, allergies, family history and social history were all reviewed and documented in the EPIC chart.   Directed ROS with pertinent positives and negatives documented in the history of present illness/assessment and plan.  Exam:  Vitals:   02/09/21 1035  BP: 138/90   General appearance:  Normal  Pelvic US today: T/V images.  Retroverted uterus enlarged with small intramural and subserosal fibroids.  The largest fibroid is measured at 1.9 x 2.1 cm.  Heterogeneous myometrium with streaky shadowing suggestive of adenomyosis.  The overall uterine size is measured at 11.69 x 9.25 x 6.56 cm.  The endometrial lining is thickened and heterogeneous measured at 17.59 mm.  Hyperechoic area at the fundal portion of the cavity suspicious for a polyp.  Right ovary normal with a dominant follicle.  Left ovary with a small avascular complex follicle measured at 1.5 x 0.5 cm.  No free fluid in the posterior cul-de-sac.   Assessment/Plan:  50 y.o. [id number]   1. Menorrhagia with regular cycle Pelvic ultrasound findings thoroughly reviewed with the patient.  Small uterine fibroids present with possible adenomyosis.  The endometrial lining is thickened and heterogeneous measured at 17.59 mm with a hypoechoic area at the fundal portion suspicious for a polyp.  Decision to proceed with a HSC/Myosure Excision/D+C/Novasure Endometrial Ablation.  Pamphlets given.  Preop preparation, Surgery and risks, as well as postop precautions and expectations thoroughly reviewed.  Preop done today.  2. Endometrial polyp Thickened endometrial line with a probable endometrial polyp.  3. Fibroids Small IM and Subserosal Fibroids.  Other orders - trimethoprim-polymyxin b (POLYTRIM) ophthalmic solution; SMARTSIG:In Eye(s) - Multiple Vitamin (MULTIVITAMIN PO); Take by mouth. chewable                         Patient was counseled as to the risk of surgery to include the following:  1. Infection (prohylactic antibiotics will be administered)  2. DVT/Pulmonary Embolism (prophylactic pneumo compression stockings will be used)  3.Trauma to internal organs requiring additional surgical procedure to repair any injury to internal organs requiring perhaps additional hospitalization days.  4.Hemmorhage requiring transfusion and blood products which carry risks such as anaphylactic reaction, hepatitis and AIDS  Patient had received literature information on the procedure scheduled and all her questions were answered and fully accepts all risk.    [name], 10:54 AM 02/09/2021

## 2021-02-10 NOTE — Telephone Encounter (Signed)
Spoke with patient regarding surgery benefits. Patient acknowledges understanding of information presented. Patient is aware that benefits presented are professional benefits only. Patient is aware the hospital will call with facility benefits. See account note.  Routing to Jill Hamm, RN.  

## 2021-02-12 NOTE — Telephone Encounter (Signed)
Spoke with patient. Reviewed surgery dates. Patient request to proceed with surgery on 03/12/21. I will return call once surgery date and time confirmed. Patient verbalizes understanding and is agreeable.   Surgery request sent.   Routing to [employer]

## 2021-02-13 ENCOUNTER — Encounter: Payer: Self-pay | Admitting: Obstetrics & Gynecology

## 2021-02-15 NOTE — Telephone Encounter (Signed)
Spoke with patient. Surgery date request confirmed.  Advised surgery is scheduled for 03/12/21, [facility] at [place]. Surgery instruction sheet and hospital brochure reviewed, printed copy will be mailed. Patient advised if Covid screening and quarantine requirements and agreeable.   Routing to provider. Encounter closed.  Cc: Hayley Carder

## 2021-02-23 ENCOUNTER — Ambulatory Visit: Payer: 59 | Admitting: Obstetrics & Gynecology

## 2021-03-08 ENCOUNTER — Other Ambulatory Visit: Payer: Self-pay

## 2021-03-08 ENCOUNTER — Encounter (HOSPITAL_BASED_OUTPATIENT_CLINIC_OR_DEPARTMENT_OTHER): Payer: Self-pay | Admitting: Obstetrics & Gynecology

## 2021-03-08 NOTE — Progress Notes (Signed)
Spoke w/ via phone for pre-op interview---pt Lab needs dos---- cbc urine preg              Lab results------none COVID test -----patient states asymptomatic no test needed Arrive at -------730 am 03-12-2021 NPO after MN NO Solid Food.  Clear liquids from MN until---630 am Med rec completed Medications to take morning of surgery -----none Diabetic medication -----n/a Patient instructed no nail polish to be worn day of surgery Patient instructed to bring photo id and insurance card day of surgery Patient aware to have Driver (ride ) / caregiver    for 24 hours after surgery  son julian age 4 will drop pt off Patient Special Instructions -----stop phentermine, last dose was 03-03-2021 per pt Pre-Op special Istructions -----none Patient verbalized understanding of instructions that were given at this phone interview. Patient denies shortness of breath, chest pain, fever, cough at this phone interview.

## 2021-03-11 ENCOUNTER — Telehealth: Payer: Self-pay | Admitting: *Deleted

## 2021-03-11 NOTE — Telephone Encounter (Signed)
Spoke with Colletta [place] in [employer], surgery cancelled.

## 2021-03-11 NOTE — Telephone Encounter (Signed)
Patient called today scheduled for : Hysteroscopy with Myosure Excision, Dilation and Curettage, Novasure Endometrial Ablation tomorrow on 05/13/20. Reports the surgery center called her and she wants to cancel her surgery due to cost she would have to pay. Will route to Glorianne Manchester, RN so she is aware.

## 2021-03-11 NOTE — Telephone Encounter (Signed)
Return call to patient to discuss cancelling surgery. Patient stated that due to hospital out of pocket cost, she is unable to proceed with surgery at this time. Patient is wanting to know if there is an alternative to "clean her out" or an in-office procedure that can be done.  Informed patient that I would touch base with Dr. Dellis Filbert and Glorianne Manchester, Nursing Supervisor, regarding alternatives and next steps. Patient agreeable and appreciative.  Routing to Dr. Dellis Filbert and Glorianne Manchester, Nursing Supervisor, to advise.

## 2021-03-12 ENCOUNTER — Ambulatory Visit (HOSPITAL_BASED_OUTPATIENT_CLINIC_OR_DEPARTMENT_OTHER): Admission: RE | Admit: 2021-03-12 | Payer: 59 | Source: Home / Self Care | Admitting: Obstetrics & Gynecology

## 2021-03-12 DIAGNOSIS — Z01818 Encounter for other preprocedural examination: Secondary | ICD-10-CM

## 2021-03-12 HISTORY — DX: Excessive and frequent menstruation with regular cycle: N92.0

## 2021-03-12 SURGERY — DILATATION & CURETTAGE/HYSTEROSCOPY WITH NOVASURE ABLATION
Anesthesia: Choice

## 2021-04-01 ENCOUNTER — Encounter: Payer: 59 | Admitting: Obstetrics & Gynecology

## 2021-05-19 ENCOUNTER — Other Ambulatory Visit: Payer: Self-pay

## 2021-05-19 ENCOUNTER — Encounter: Payer: Self-pay | Admitting: Obstetrics & Gynecology

## 2021-05-19 ENCOUNTER — Telehealth: Payer: Self-pay

## 2021-05-19 ENCOUNTER — Ambulatory Visit (INDEPENDENT_AMBULATORY_CARE_PROVIDER_SITE_OTHER): Payer: 59 | Admitting: Obstetrics & Gynecology

## 2021-05-19 ENCOUNTER — Other Ambulatory Visit (HOSPITAL_COMMUNITY)
Admission: RE | Admit: 2021-05-19 | Discharge: 2021-05-19 | Disposition: A | Discharge status: Home / Self Care | Payer: 59 | Source: Ambulatory Visit | Attending: Obstetrics & Gynecology | Admitting: Obstetrics & Gynecology

## 2021-05-19 VITALS — BP 130/92 | HR 78 | Resp 16 | Ht 66.5 in | Wt 201.0 lb

## 2021-05-19 DIAGNOSIS — N840 Polyp of corpus uteri: Secondary | ICD-10-CM | POA: Diagnosis not present

## 2021-05-19 DIAGNOSIS — Z01419 Encounter for gynecological examination (general) (routine) without abnormal findings: Secondary | ICD-10-CM | POA: Diagnosis present

## 2021-05-19 DIAGNOSIS — Z789 Other specified health status: Secondary | ICD-10-CM

## 2021-05-19 DIAGNOSIS — Z113 Encounter for screening for infections with a predominantly sexual mode of transmission: Secondary | ICD-10-CM

## 2021-05-19 NOTE — Telephone Encounter (Signed)
-----   Message from [name], MD sent at 05/19/2021  4:04 PM EST ----- Regarding: Reschedule surgery Surgery: CPT [phone] - Hysteroscopy/D&C/Myosure Excision of Polyp  Diagnosis: R93.89 Thickened Endometrium, N84.0 Endometrial Polyp  Location: [place]  Status: Outpatient  Time: 30 Minutes  Assistant: N/A  Urgency: First Available  Pre-Op Appointment: Completed  Post-Op Appointment(s): 2 Weeks  Time Out Of Work: Day Of Surgery ONLY

## 2021-05-19 NOTE — Progress Notes (Signed)
Courtney Bowman 1970-12-07 [id number]   History:    51 y.o. G2P2L2  RP:  Established patient presenting for annual gyn exam   HPI: Heavy menses every month with cramps.  No BTB. No pelvic pain outside the periods.  H/O Endometrial Polyp removed in 2016. Stopped BCPs, declined Megace.  Pelvic US in 01/2021 showed a thickened endometrial line with a probable Polyp.  Small SS and IM fibroids were present.  A surgery was scheduled in 02/2021, but patient canceled d/t costs.  Pap 09/2019 Neg.  Breasts normal.  Mammo 10/2020 Neg.  Colono 2022.  BMI 31.96.   Past medical history,surgical history, family history and social history were all reviewed and documented in the EPIC chart.  Gynecologic History Patient's last menstrual period was 04/13/2021 (exact date).  Obstetric History OB History  Gravida Para Term Preterm AB Living  2 2     0 2  SAB IAB Ectopic Multiple Live Births               # Outcome Date GA Lbr Len/2nd Weight Sex Delivery Anes PTL Lv  2 Para           1 Para              ROS: A ROS was performed and pertinent positives and negatives are included in the history.  GENERAL: No fevers or chills. HEENT: No change in vision, no earache, sore throat or sinus congestion. NECK: No pain or stiffness. CARDIOVASCULAR: No chest pain or pressure. No palpitations. PULMONARY: No shortness of breath, cough or wheeze. GASTROINTESTINAL: No abdominal pain, nausea, vomiting or diarrhea, melena or bright red blood per rectum. GENITOURINARY: No urinary frequency, urgency, hesitancy or dysuria. MUSCULOSKELETAL: No joint or muscle pain, no back pain, no recent trauma. DERMATOLOGIC: No rash, no itching, no lesions. ENDOCRINE: No polyuria, polydipsia, no heat or cold intolerance. No recent change in weight. HEMATOLOGICAL: No anemia or easy bruising or bleeding. NEUROLOGIC: No headache, seizures, numbness, tingling or weakness. PSYCHIATRIC: No depression, no loss of interest in normal activity or change  in sleep pattern.     Exam:   BP (!) 130/92    Pulse 78    Resp 16    Ht 5' 6.5" (1.689 m)    Wt 201 lb (91.2 kg)    LMP 04/13/2021 (Exact Date)    BMI 31.96 kg/m   Body mass index is 31.96 kg/m.  General appearance : Well developed well nourished female. No acute distress HEENT: Eyes: no retinal hemorrhage or exudates,  Neck supple, trachea midline, no carotid bruits, no thyroidmegaly Lungs: Clear to auscultation, no rhonchi or wheezes, or rib retractions  Heart: Regular rate and rhythm, no murmurs or gallops Breast:Examined in sitting and supine position were symmetrical in appearance, no palpable masses or tenderness,  no skin retraction, no nipple inversion, no nipple discharge, no skin discoloration, no axillary or supraclavicular lymphadenopathy Abdomen: no palpable masses or tenderness, no rebound or guarding Extremities: no edema or skin discoloration or tenderness  Pelvic: Vulva: Normal             Vagina: No gross lesions or discharge  Cervix: No gross lesions or discharge.  Pap reflex/Gono-Chlam done.  Uterus  AV, mild increase in size, nodular, non-tender and mobile  Adnexa  Without masses or tenderness  Anus: Normal  Pelvic US 02/09/2021: T/V images.  Retroverted uterus enlarged with small intramural and subserosal fibroids.  The largest fibroid is measured at 1.9 x 2.1  cm.  Heterogeneous myometrium with streaky shadowing suggestive of adenomyosis.  The overall uterine size is measured at 11.69 x 9.25 x 6.56 cm.  The endometrial lining is thickened and heterogeneous measured at 17.59 mm.  Hyperechoic area at the fundal portion of the cavity suspicious for a polyp.  Right ovary normal with a dominant follicle.  Left ovary with a small avascular complex follicle measured at 1.5 x 0.5 cm.  No free fluid in the posterior cul-de-sac.   Assessment/Plan:  51 y.o. female for annual exam   1. Encounter for routine gynecological examination with Papanicolaou smear of cervix Heavy  menses every month with cramps.  No BTB. No pelvic pain outside the periods.  H/O Endometrial Polyp removed in 2016. Stopped BCPs, declined Megace.  Pelvic US in 01/2021 showed a thickened endometrial line with a probable Polyp.  Small SS and IM fibroids were present.  A surgery was scheduled in 02/2021, but patient canceled d/t costs.  Pap 09/2019 Neg.  Breasts normal.  Mammo 10/2020 Neg.  Colono 2022.  BMI 31.96. - Cytology - PAP( [facility])  2. Use of condoms for contraception  3. Screen for STD (sexually transmitted disease) - HIV antibody (with reflex) - RPR - Hepatitis B Surface AntiGEN - Hepatitis C Antibody - Gono-Chlam on Pap  4. Endometrial polyp  The endometrial lining is thickened and heterogeneous measured at 17.59 mm with a hypoechoic area at the fundal portion suspicious for a polyp.  Decision to proceed with a HSC/Myosure Excision/D+C. Preop preparation, Surgery and risks, as well as postop precautions and expectations thoroughly reviewed previously.                          Patient was counseled as to the risk of surgery to include the following:  1. Infection (prohylactic antibiotics will be administered)  2. DVT/Pulmonary Embolism (prophylactic pneumo compression stockings will be used)  3.Trauma to internal organs requiring additional surgical procedure to repair any injury to internal organs requiring perhaps additional hospitalization days.  4.Hemmorhage requiring transfusion and blood products which carry risks such as anaphylactic reaction, hepatitis and AIDS  Patient had received literature information on the procedure scheduled and all her questions were answered and fully accepts all risk.   Marie-Lyne LavoieMD5:05 PM    [name], 3:47 PM 05/19/2021

## 2021-05-20 ENCOUNTER — Telehealth: Payer: Self-pay | Admitting: *Deleted

## 2021-05-20 LAB — HEPATITIS C ANTIBODY
Hepatitis C Ab: NONREACTIVE
SIGNAL TO CUT-OFF: 0.03 (ref <1.00)

## 2021-05-20 LAB — RPR: RPR Ser Ql: NONREACTIVE

## 2021-05-20 LAB — HIV ANTIBODY (ROUTINE TESTING W REFLEX): HIV 1&2 Ab, 4th Generation: NONREACTIVE

## 2021-05-20 LAB — HEPATITIS B SURFACE ANTIGEN: Hepatitis B Surface Ag: NONREACTIVE

## 2021-05-20 NOTE — Telephone Encounter (Signed)
Spoke with patient regarding surgery benefits. Patient acknowledges understanding of information presented. Patient is aware that benefits presented are professional benefits only. Patient is aware the hospital will call with facility benefits. See account note.  Routing to Jill Hamm, RN.  

## 2021-05-20 NOTE — Telephone Encounter (Signed)
Patient was seen yesterday for annual exam. She thought a blood pregnancy test was going to be checked,however it was not. I checked with lab to see if this could be added and Elmyra Ricks said it could. Okay to add Qual beta HCG? Please advise

## 2021-05-21 LAB — CYTOLOGY - PAP
Chlamydia: NEGATIVE
Comment: NEGATIVE
Comment: NORMAL
Diagnosis: NEGATIVE
Neisseria Gonorrhea: NEGATIVE

## 2021-05-21 NOTE — Telephone Encounter (Signed)
Call to patient. Patient states she is unavailable to talk right now. She will return call.

## 2021-05-24 NOTE — Telephone Encounter (Signed)
Spoke with patient. Reviewed surgery dates. Patient request to proceed with surgery on 06/11/21, declined earlier date. I will return call once surgery date and time confirmed. Patient verbalizes understanding and is agreeable.   Surgery request sent.

## 2021-05-25 NOTE — Telephone Encounter (Addendum)
Spoke with patient. Surgery date request confirmed.  Advised surgery is scheduled for 06/11/21 at [place], [facility]  Surgery instruction sheet and hospital brochure reviewed, printed copy will be mailed.  Patient advised if Covid screening and quarantine requirements and agreeable.   Routing to provider. Encounter closed.  Cc: Hayley Carder

## 2021-05-31 NOTE — Telephone Encounter (Signed)
Dr.Lavoie replied "Please offer a sPT to patient, she will need to come back to the lab. " ? ? ?Patient informed, she declined to return for this. ?

## 2021-06-07 ENCOUNTER — Encounter (HOSPITAL_BASED_OUTPATIENT_CLINIC_OR_DEPARTMENT_OTHER): Payer: Self-pay | Admitting: Obstetrics & Gynecology

## 2021-06-08 ENCOUNTER — Other Ambulatory Visit: Payer: Self-pay

## 2021-06-08 ENCOUNTER — Encounter (HOSPITAL_BASED_OUTPATIENT_CLINIC_OR_DEPARTMENT_OTHER): Payer: Self-pay | Admitting: Obstetrics & Gynecology

## 2021-06-08 NOTE — Progress Notes (Signed)
Spoke w/ via phone for pre-op interview--- pt ?Lab needs dos----   urine preg (per anes)/  pre-op orders pending           ?Lab results------ no ?COVID test -----patient states asymptomatic no test needed ?Arrive at ------- 0700 on 06-11-2021 ?NPO after MN NO Solid Food.  Clear liquids from MN until--- 0530 ?Med rec completed ?Medications to take morning of surgery ----- none ?Diabetic medication ----- n/a ?Patient instructed no nail polish to be worn day of surgery ?Patient instructed to bring photo id and insurance card day of surgery ?Patient aware to have Driver (ride ) / caregiver for 24 hours after surgery --daughter, courtney Grandville Silos (age 51) ?Patient Special Instructions ----- n/a ?Pre-Op special Istructions ----- sent inbox message to dr Dellis Filbert in epic , requested orders. ?Pt will arrive at 0700, stated unable to arrive any earlier due to her ride. ? ?Patient verbalized understanding of instructions that were given at this phone interview. ?Patient denies shortness of breath, chest pain, fever, cough at this phone interview.  ?

## 2021-06-11 ENCOUNTER — Ambulatory Visit (HOSPITAL_BASED_OUTPATIENT_CLINIC_OR_DEPARTMENT_OTHER): Payer: 59 | Admitting: Anesthesiology

## 2021-06-11 ENCOUNTER — Encounter (HOSPITAL_BASED_OUTPATIENT_CLINIC_OR_DEPARTMENT_OTHER)
Admission: RE | Disposition: A | Discharge status: Home / Self Care | Payer: Self-pay | Source: Ambulatory Visit | Attending: Obstetrics & Gynecology

## 2021-06-11 ENCOUNTER — Encounter (HOSPITAL_BASED_OUTPATIENT_CLINIC_OR_DEPARTMENT_OTHER): Payer: Self-pay | Admitting: Obstetrics & Gynecology

## 2021-06-11 ENCOUNTER — Ambulatory Visit (HOSPITAL_BASED_OUTPATIENT_CLINIC_OR_DEPARTMENT_OTHER)
Admission: RE | Admit: 2021-06-11 | Discharge: 2021-06-11 | Disposition: A | Discharge status: Home / Self Care | Payer: 59 | Source: Ambulatory Visit | Attending: Obstetrics & Gynecology | Admitting: Obstetrics & Gynecology

## 2021-06-11 ENCOUNTER — Other Ambulatory Visit: Payer: Self-pay

## 2021-06-11 DIAGNOSIS — N920 Excessive and frequent menstruation with regular cycle: Secondary | ICD-10-CM | POA: Insufficient documentation

## 2021-06-11 DIAGNOSIS — N840 Polyp of corpus uteri: Secondary | ICD-10-CM

## 2021-06-11 DIAGNOSIS — I10 Essential (primary) hypertension: Secondary | ICD-10-CM | POA: Insufficient documentation

## 2021-06-11 DIAGNOSIS — Z01818 Encounter for other preprocedural examination: Secondary | ICD-10-CM

## 2021-06-11 DIAGNOSIS — R9389 Abnormal findings on diagnostic imaging of other specified body structures: Secondary | ICD-10-CM

## 2021-06-11 HISTORY — PX: DILATATION & CURETTAGE/HYSTEROSCOPY WITH MYOSURE: SHX6511

## 2021-06-11 HISTORY — DX: Other specified postprocedural states: Z98.890

## 2021-06-11 HISTORY — DX: Nausea with vomiting, unspecified: R11.2

## 2021-06-11 HISTORY — DX: Abnormal findings on diagnostic imaging of other specified body structures: R93.89

## 2021-06-11 HISTORY — DX: Leiomyoma of uterus, unspecified: D25.9

## 2021-06-11 HISTORY — DX: Polyp of corpus uteri: N84.0

## 2021-06-11 LAB — CBC
HCT: 37.4 % (ref 36.0–46.0)
Hemoglobin: 11.5 g/dL — ABNORMAL LOW (ref 12.0–15.0)
MCH: 23.4 pg — ABNORMAL LOW (ref 26.0–34.0)
MCHC: 30.7 g/dL (ref 30.0–36.0)
MCV: 76.2 fL — ABNORMAL LOW (ref 80.0–100.0)
Platelets: 319 10*3/uL (ref 150–400)
RBC: 4.91 MIL/uL (ref 3.87–5.11)
RDW: 19.3 % — ABNORMAL HIGH (ref 11.5–15.5)
WBC: 9.2 10*3/uL (ref 4.0–10.5)
nRBC: 0 % (ref 0.0–0.2)

## 2021-06-11 LAB — POCT PREGNANCY, URINE: Preg Test, Ur: NEGATIVE

## 2021-06-11 SURGERY — DILATATION & CURETTAGE/HYSTEROSCOPY WITH MYOSURE
Anesthesia: General | Site: Uterus

## 2021-06-11 MED ORDER — LIDOCAINE 2% (20 MG/ML) 5 ML SYRINGE
INTRAMUSCULAR | Status: DC | PRN
  Administered 2021-06-11: 60 mg via INTRAVENOUS

## 2021-06-11 MED ORDER — FENTANYL CITRATE (PF) 250 MCG/5ML IJ SOLN: INTRAMUSCULAR | Status: DC | PRN

## 2021-06-11 MED ORDER — AMISULPRIDE (ANTIEMETIC) 5 MG/2ML IV SOLN: 10.0000 mg | Freq: Once | INTRAVENOUS | Status: DC | PRN

## 2021-06-11 MED ORDER — ONDANSETRON HCL 4 MG/2ML IJ SOLN
4.0000 mg | Freq: Once | INTRAMUSCULAR | Status: AC | PRN
  Administered 2021-06-11: 4 mg via INTRAVENOUS

## 2021-06-11 MED ORDER — SCOPOLAMINE 1 MG/3DAYS TD PT72
MEDICATED_PATCH | TRANSDERMAL | Status: DC | PRN
  Administered 2021-06-11: 1 via TRANSDERMAL

## 2021-06-11 MED ORDER — KETOROLAC TROMETHAMINE 30 MG/ML IJ SOLN
INTRAMUSCULAR | Status: DC | PRN
  Administered 2021-06-11: 30 mg via INTRAVENOUS

## 2021-06-11 MED ORDER — PROPOFOL 10 MG/ML IV BOLUS
INTRAVENOUS | Status: AC
  Filled 2021-06-11: qty 20

## 2021-06-11 MED ORDER — POVIDONE-IODINE 10 % EX SWAB: 2.0000 "application " | Freq: Once | CUTANEOUS | Status: DC

## 2021-06-11 MED ORDER — FENTANYL CITRATE (PF) 250 MCG/5ML IJ SOLN
INTRAMUSCULAR | Status: DC | PRN
  Administered 2021-06-11: 25 ug via INTRAVENOUS
  Administered 2021-06-11: 50 ug via INTRAVENOUS
  Administered 2021-06-11: 25 ug via INTRAVENOUS

## 2021-06-11 MED ORDER — CEFAZOLIN SODIUM-DEXTROSE 2-4 GM/100ML-% IV SOLN
2.0000 g | INTRAVENOUS | Status: AC
  Administered 2021-06-11: 2 g via INTRAVENOUS

## 2021-06-11 MED ORDER — SCOPOLAMINE 1 MG/3DAYS TD PT72
MEDICATED_PATCH | TRANSDERMAL | Status: AC
  Filled 2021-06-11: qty 1

## 2021-06-11 MED ORDER — FENTANYL CITRATE (PF) 100 MCG/2ML IJ SOLN: 25.0000 ug | INTRAMUSCULAR | Status: DC | PRN

## 2021-06-11 MED ORDER — MIDAZOLAM HCL 2 MG/2ML IJ SOLN
INTRAMUSCULAR | Status: DC | PRN
  Administered 2021-06-11: 2 mg via INTRAVENOUS

## 2021-06-11 MED ORDER — PHENYLEPHRINE 40 MCG/ML (10ML) SYRINGE FOR IV PUSH (FOR BLOOD PRESSURE SUPPORT)
PREFILLED_SYRINGE | INTRAVENOUS | Status: AC
  Filled 2021-06-11: qty 10

## 2021-06-11 MED ORDER — ONDANSETRON HCL 4 MG/2ML IJ SOLN
INTRAMUSCULAR | Status: AC
  Filled 2021-06-11: qty 2

## 2021-06-11 MED ORDER — OXYCODONE HCL 5 MG PO TABS: 5.0000 mg | ORAL_TABLET | Freq: Once | ORAL | Status: DC | PRN

## 2021-06-11 MED ORDER — PROPOFOL 10 MG/ML IV BOLUS
INTRAVENOUS | Status: DC | PRN
Start: 2021-06-11 — End: 2021-06-11
  Administered 2021-06-11: 50 mg via INTRAVENOUS
  Administered 2021-06-11: 200 mg via INTRAVENOUS

## 2021-06-11 MED ORDER — DEXAMETHASONE SODIUM PHOSPHATE 10 MG/ML IJ SOLN
INTRAMUSCULAR | Status: AC
  Filled 2021-06-11: qty 1

## 2021-06-11 MED ORDER — LACTATED RINGERS IV SOLN: INTRAVENOUS | Status: DC

## 2021-06-11 MED ORDER — ACETAMINOPHEN 500 MG PO TABS
ORAL_TABLET | ORAL | Status: AC
  Filled 2021-06-11: qty 2

## 2021-06-11 MED ORDER — FENTANYL CITRATE (PF) 100 MCG/2ML IJ SOLN
INTRAMUSCULAR | Status: AC
Start: 2021-06-11 — End: ?
  Filled 2021-06-11: qty 2

## 2021-06-11 MED ORDER — KETOROLAC TROMETHAMINE 30 MG/ML IJ SOLN
INTRAMUSCULAR | Status: AC
  Filled 2021-06-11: qty 1

## 2021-06-11 MED ORDER — DEXAMETHASONE SODIUM PHOSPHATE 10 MG/ML IJ SOLN
INTRAMUSCULAR | Status: DC | PRN
Start: 2021-06-11 — End: 2021-06-11
  Administered 2021-06-11 (×2): 5 mg via INTRAVENOUS

## 2021-06-11 MED ORDER — ACETAMINOPHEN 500 MG PO TABS
1000.0000 mg | ORAL_TABLET | Freq: Once | ORAL | Status: AC
  Administered 2021-06-11: 1000 mg via ORAL

## 2021-06-11 MED ORDER — LIDOCAINE HCL 1 % IJ SOLN
INTRAMUSCULAR | Status: DC | PRN
  Administered 2021-06-11: 20 mL

## 2021-06-11 MED ORDER — SODIUM CHLORIDE 0.9 % IR SOLN
Status: DC | PRN
  Administered 2021-06-11: 3000 mL

## 2021-06-11 MED ORDER — PHENYLEPHRINE 40 MCG/ML (10ML) SYRINGE FOR IV PUSH (FOR BLOOD PRESSURE SUPPORT)
PREFILLED_SYRINGE | INTRAVENOUS | Status: DC | PRN
Start: 2021-06-11 — End: 2021-06-11
  Administered 2021-06-11: 40 ug via INTRAVENOUS

## 2021-06-11 MED ORDER — MIDAZOLAM HCL 2 MG/2ML IJ SOLN
INTRAMUSCULAR | Status: AC
  Filled 2021-06-11: qty 2

## 2021-06-11 MED ORDER — ONDANSETRON HCL 4 MG/2ML IJ SOLN
INTRAMUSCULAR | Status: DC | PRN
Start: 2021-06-11 — End: 2021-06-11
  Administered 2021-06-11: 4 mg via INTRAVENOUS

## 2021-06-11 MED ORDER — CEFAZOLIN SODIUM-DEXTROSE 2-4 GM/100ML-% IV SOLN
INTRAVENOUS | Status: AC
  Filled 2021-06-11: qty 100

## 2021-06-11 MED ORDER — PROPOFOL 10 MG/ML IV BOLUS
INTRAVENOUS | Status: AC
Start: 2021-06-11 — End: ?
  Filled 2021-06-11: qty 20

## 2021-06-11 MED ORDER — OXYCODONE HCL 5 MG/5ML PO SOLN: 5.0000 mg | Freq: Once | ORAL | Status: DC | PRN

## 2021-06-11 SURGICAL SUPPLY — 22 items
CATH ROBINSON RED A/P 16FR (CATHETERS) ×3 IMPLANT
DEVICE MYOSURE LITE (MISCELLANEOUS) ×1 IMPLANT
DEVICE MYOSURE REACH (MISCELLANEOUS) IMPLANT
DILATOR CANAL MILEX (MISCELLANEOUS) IMPLANT
DRSG TELFA 3X8 NADH (GAUZE/BANDAGES/DRESSINGS) ×2 IMPLANT
ELECT REM PT RETURN 9FT ADLT (ELECTROSURGICAL)
ELECTRODE REM PT RTRN 9FT ADLT (ELECTROSURGICAL) IMPLANT
GAUZE 4X4 16PLY ~~LOC~~+RFID DBL (SPONGE) ×5 IMPLANT
GLOVE SURG ENC MOIS LTX SZ6.5 (GLOVE) ×3 IMPLANT
GLOVE SURG UNDER POLY LF SZ7 (GLOVE) ×6 IMPLANT
GOWN STRL REUS W/TWL LRG LVL3 (GOWN DISPOSABLE) ×6 IMPLANT
IV NS IRRIG 3000ML ARTHROMATIC (IV SOLUTION) ×4 IMPLANT
KIT PROCEDURE FLUENT (KITS) ×3 IMPLANT
KIT TURNOVER CYSTO (KITS) ×3 IMPLANT
MYOSURE XL FIBROID (MISCELLANEOUS)
PACK VAGINAL MINOR WOMEN LF (CUSTOM PROCEDURE TRAY) ×3 IMPLANT
PAD DRESSING TELFA 3X8 NADH (GAUZE/BANDAGES/DRESSINGS) ×2 IMPLANT
PAD OB MATERNITY 4.3X12.25 (PERSONAL CARE ITEMS) ×3 IMPLANT
PAD PREP 24X48 CUFFED NSTRL (MISCELLANEOUS) ×3 IMPLANT
SEAL CERVICAL OMNI LOK (ABLATOR) IMPLANT
SEAL ROD LENS SCOPE MYOSURE (ABLATOR) ×3 IMPLANT
SYSTEM TISS REMOVAL MYOSURE XL (MISCELLANEOUS) IMPLANT

## 2021-06-11 NOTE — Discharge Instructions (Addendum)
?  Post Anesthesia Home Care Instructions ? ?Activity: ?Get plenty of rest for the remainder of the day. A responsible individual must stay with you for 24 hours following the procedure.  ?For the next 24 hours, DO NOT: ?-Drive a car ?-Paediatric nurse ?-Drink alcoholic beverages ?-Take any medication unless instructed by your physician ?-Make any legal decisions or sign important papers. ? ?Meals: ?Start with liquid foods such as gelatin or soup. Progress to regular foods as tolerated. Avoid greasy, spicy, heavy foods. If nausea and/or vomiting occur, drink only clear liquids until the nausea and/or vomiting subsides. Call your physician if vomiting continues. ? ?Special Instructions/Symptoms: ?Your throat may feel dry or sore from the anesthesia or the breathing tube placed in your throat during surgery. If this causes discomfort, gargle with warm salt water. The discomfort should disappear within 24 hours. ? ?If you had a scopolamine patch placed behind your ear for the management of post- operative nausea and/or vomiting: ? ?1. The medication in the patch is effective for 72 hours, after which it should be removed.  Wrap patch in a tissue and discard in the trash. Wash hands thoroughly with soap and water. ?2. You may remove the patch earlier than 72 hours if you experience unpleasant side effects which may include dry mouth, dizziness or visual disturbances. ?3. Avoid touching the patch. Wash your hands with soap and water after contact with the patch. ?   ?No ibuprofen, Advil, Aleve, Motrin, ketorolac, meloxicam, naproxen, or other NSAIDS until after 3:15 pm today if needed. ?No tylenol/acetaminophen until after 1:30 pm today if needed.  ? ?D & C Home care Instructions: ? ?Personal hygiene:  Used sanitary napkins for vaginal drainage, not tampons. Shower or tub bathe the day after your procedure. No douching until bleeding stops. Always wipe from front to back after  Elimination. ? ?Activity: Do not drive or  operate any equipment today. The effects of the anesthesia are still present and drowsiness may result. Rest today, not necessarily flat bed rest, just take it easy. You may resume your normal activity in one to 2 days. ? ?Sexual activity: No intercourse for one week or as indicated by your physician ? ?Diet: Eat a light diet as desired this evening. You may resume a regular diet tomorrow. ? ?Return to work: One to 2 days. ? ?General Expectations of your surgery: Vaginal bleeding should be no heavier than a normal period. Spotting may continue up to 10 days. Mild cramps may continue for a couple of days. You may have a regular period in 2-6 weeks. ? ?Unexpected observations call your doctor if these occur: persistent or heavy bleeding. Severe abdominal cramping or pain. Elevation of temperature greater than 100?F. ? ?Call for an appointment in one week. ?

## 2021-06-11 NOTE — Anesthesia Preprocedure Evaluation (Addendum)
Anesthesia Evaluation  ?Patient identified by MRN, date of birth, ID band ?Patient awake ? ? ? ?Reviewed: ?Allergy & Precautions, NPO status , Patient's Chart, lab work & pertinent test results ? ?Airway ?Mallampati: II ? ?TM Distance: >3 FB ?Neck ROM: Full ? ? ? Dental ? ?(+) Dental Advisory Given, Teeth Intact ?  ?Pulmonary ?neg pulmonary ROS,  ?  ?Pulmonary exam normal ?breath sounds clear to auscultation ? ? ? ? ? ? Cardiovascular ?hypertension (no meds- SBP 160s in preop), Normal cardiovascular exam ?Rhythm:Regular Rate:Normal ? ? ?  ?Neuro/Psych ?negative neurological ROS ? negative psych ROS  ? GI/Hepatic ?negative GI ROS, Neg liver ROS,   ?Endo/Other  ?negative endocrine ROS ? Renal/GU ?negative Renal ROS  ?negative genitourinary ?  ?Musculoskeletal ?negative musculoskeletal ROS ?(+)  ? Abdominal ?  ?Peds ? Hematology ?negative hematology ROS ?(+)   ?Anesthesia Other Findings ? ? Reproductive/Obstetrics ?thickened endometrium  ? ?  ? ? ? ? ? ? ? ? ? ? ? ? ? ?  ?  ? ? ? ? ? ? ? ?Anesthesia Physical ?Anesthesia Plan ? ?ASA: 2 ? ?Anesthesia Plan: General  ? ?Post-op Pain Management: Tylenol PO (pre-op)*  ? ?Induction: Intravenous ? ?PONV Risk Score and Plan: 4 or greater and Ondansetron, Dexamethasone, Midazolam and Treatment may vary due to age or medical condition ? ?Airway Management Planned: LMA ? ?Additional Equipment: None ? ?Intra-op Plan:  ? ?Post-operative Plan: Extubation in OR ? ?Informed Consent: I have reviewed the patients History and Physical, chart, labs and discussed the procedure including the risks, benefits and alternatives for the proposed anesthesia with the patient or authorized representative who has indicated his/her understanding and acceptance.  ? ? ? ?Dental advisory given ? ?Plan Discussed with: CRNA ? ?Anesthesia Plan Comments:   ? ? ? ? ? ?Anesthesia Quick Evaluation ? ?

## 2021-06-11 NOTE — Addendum Note (Signed)
Addendum  created 06/11/21 1016 by Pervis Hocking, DO  ? Clinical Note Signed, Intraprocedure Meds edited  ?  ?

## 2021-06-11 NOTE — Anesthesia Postprocedure Evaluation (Addendum)
Anesthesia Post Note ? ?Patient: [name] ? ?Procedure(s) Performed: DILATATION & CURETTAGE/HYSTEROSCOPY WITH MYOSURE (Uterus) ? ?  ? ?Patient location during evaluation: PACU ?Anesthesia Type: General ?Level of consciousness: awake and alert, oriented and patient cooperative ?Pain management: pain level controlled ?Vital Signs Assessment: post-procedure vital signs reviewed and stable ?Respiratory status: spontaneous breathing, nonlabored ventilation and respiratory function stable ?Cardiovascular status: blood pressure returned to baseline and stable ?Anesthetic complications: yes ?Comments: PONV requiring rescue antiemetics- recommend TIVA for future anesthetics  ? ? ?No notable events documented. ? ?Last Vitals:  ?Vitals:  ? 06/11/21 0920 06/11/21 0922  ?BP: (!) 157/96   ?Pulse: (!) 102 (!) 102  ?Resp: 15 19  ?Temp: 36.4 ?C   ?SpO2: 90% 96%  ?  ?Last Pain:  ?Vitals:  ? 06/11/21 0920  ?TempSrc:   ?PainSc: Asleep  ? ? ?  ?  ?  ?  ?  ?  ? ?Jarome Matin Sekai Gitlin ? ? ? ? ?

## 2021-06-11 NOTE — Op Note (Addendum)
Operative Note ? ?06/11/2021 ? ?9:11 AM ? ?PATIENT:  Courtney Bowman  51 y.o. female ? ?PRE-OPERATIVE DIAGNOSIS:  Thickened endometrium, endometrial polyp ? ?POST-OPERATIVE DIAGNOSIS:  Thickened endometrium, endometrial polyp ? ?PROCEDURE:  Procedure(s): ?DILATATION & CURETTAGE/HYSTEROSCOPY WITH MYOSURE EXCISION ? ?SURGEON:  Surgeon(s): ?[name], MD ? ?ANESTHESIA:   general ? ?FINDINGS: 3 endometrial polyps at the fundal area of the uterine cavity.  Intramural fibroids changing the shape of the uterine cavity,but no submucosal fibroid. ? ?DESCRIPTION OF OPERATION: Under general anesthesia with laryngeal mask, the patient is in lithotomy position.  She is prepped with Betadine on the suprapubic, vulvar and vaginal areas.  The bladder was emptied before coming to the OR.  The patient is draped as usual.  The gynecologic exam reveals a retroverted uterus which is mobile, mildly enlarged in volume and nodular.  No adnexal mass felt.  The speculum is inserted in the vagina and the anterior lip of the cervix is grasped with a tenaculum.  A paracervical block is done with lidocaine 1% a total of 20 cc at 4 and 8:00.  Dilation of the cervix with Pratt dilators up to #19 without difficulty.  The hysteroscope was inserted in the intra uterine cavity and inspection reveals 3 polyps at the fundal aspect of the intra uterine cavity.  Both ostia are well visualized.  The cavity is deformed by intramural fibroids but no submucosal fibroid is present.  Pictures are taken.  We add the light MyoSure through the hysteroscope.  The 3 polyps are completely excised.  Areas of mild increase in endometrial thickness are also excised.  Pictures are taken after excisions.  Hemostasis is adequate.  The instruments are removed.  Hemostasis is adequate on the cervix after removal of the tenaculum.  The speculum is removed.  The patient tolerated the procedure well and was brought to recovery room in good and stable  status. ? ?ESTIMATED BLOOD LOSS: 5 mL ?Fluid deficit: 330 mL ? ? ?Intake/Output Summary (Last 24 hours) at 06/11/2021 0911 ?Last data filed at 06/11/2021 0909 ?Gross per 24 hour  ?Intake 400 ml  ?Output --  ?Net 400 ml  ?  ? ?BLOOD ADMINISTERED:none  ? ?LOCAL MEDICATIONS USED:  LIDOCAINE 1% 20 cc for Paracervical block ? ?SPECIMEN:  Source of Specimen:  Excision specimen of endometrial polyps and endometrial curettings ? ?DISPOSITION OF SPECIMEN:  PATHOLOGY ? ?COUNTS:  YES ? ?PLAN OF CARE: Transfer to PACU ? ?Marie-Lyne LavoieMD9:11 AM ? ? ?   ?

## 2021-06-11 NOTE — Transfer of Care (Signed)
Immediate Anesthesia Transfer of Care Note ? ?Patient: Courtney Bowman ? ?Procedure(s) Performed: Procedure(s) (LRB): ?DILATATION & CURETTAGE/HYSTEROSCOPY WITH MYOSURE (N/A) ? ?Patient Location: PACU ? ?Anesthesia Type: General ? ?Level of Consciousness: awake, oriented, sedated and patient cooperative ? ?Airway & Oxygen Therapy: Patient Spontanous Breathing and Patient connected to face mask oxygen ? ?Post-op Assessment: Report given to PACU RN and Post -op Vital signs reviewed and stable ? ?Post vital signs: Reviewed and stable ? ?Complications: No apparent anesthesia complications ? ?Last Vitals:  ?Vitals Value Taken Time  ?BP 157/96 06/11/21 0920  ?Temp 36.4 ?C 06/11/21 0920  ?Pulse 93 06/11/21 0925  ?Resp 15 06/11/21 0925  ?SpO2 97 % 06/11/21 0925  ?Vitals shown include unvalidated device data. ? ?Last Pain:  ?Vitals:  ? 06/11/21 0734  ?TempSrc: Oral  ?PainSc: 0-No pain  ?   ? ?Patients Stated Pain Goal: 3 (06/11/21 0734) ? ?Complications: No notable events documented. ?

## 2021-06-11 NOTE — H&P (Signed)
?Courtney Bowman is an 51 y.o. female. G2P2L2 ?  ?RP: Heavy menses/Endometrial Polyp for HSC/Myosure Excision/D+C ?  ?HPI: Mild bleeding currently.  Heavy menses every month with cramps.  No BTB. No pelvic pain outside the periods.  H/O Endometrial Polyp removed in 2016. Stopped BCPs, declined Megace.  Pelvic US in 01/2021 showed a thickened endometrial line with a probable Polyp.  Small SS and IM fibroids were present.  A surgery was scheduled in 02/2021, but patient canceled d/t costs.  ? ?Pertinent Gynecological History: ?Menses: flow is excessive with use of many pads or tampons on heaviest days ?Contraception: condoms ?Blood transfusions: none  ?Last mammogram: normal  ?Last pap: normal  ? ? ?Menstrual History: ? ?No LMP recorded. (Menstrual status: Irregular Periods). ?  ? ?Past Medical History:  ?Diagnosis Date  ? Anemia   ? Blood type B-   ? Endometrial polyp   ? Leiomyoma of uterus   ? Menorrhagia   ? Thickened endometrium   ? ? ?Past Surgical History:  ?Procedure Laterality Date  ? BREAST CYST ASPIRATION Left 08/2020  ? CESAREAN SECTION    ? x2   last one 12-23-1999  '@[facility]'   ? colonscopy  2022  ? DILATATION & CURETTAGE/HYSTEROSCOPY WITH MYOSURE N/A 12/16/2014  ? Procedure: [place];  Surgeon: Anastasio Auerbach, MD;  Location: [facility];  Service: Gynecology;  Laterality: N/A;  ? OPEN REDUCTION INTERNAL FIXATION (ORIF) DISTAL RADIAL FRACTURE Right 02/28/2014  ? Procedure: OPEN REDUCTION INTERNAL FIXATION (ORIF) RIGHT DISTAL RADIAL FRACTURE (wrist);  Surgeon: Johnny Bridge, MD;  Location: [facility];  Service: Orthopedics;  Laterality: Right;  ? WISDOM TOOTH EXTRACTION    ? age 70  ? ? ?Family History  ?Problem Relation Age of Onset  ? Cancer Father   ?     multiple myeloma  ? ? ?Social History:  reports that she has never smoked. She has never used smokeless tobacco. She reports current alcohol use. She reports that she does not use drugs. ? ?Allergies:   ?Allergies  ?Allergen Reactions  ? Hydrocodone Nausea And Vomiting  ?  N/v without food   ? ? ?Medications Prior to Admission  ?Medication Sig Dispense Refill Last Dose  ? ibuprofen (ADVIL,MOTRIN) 200 MG tablet Take 400 mg by mouth every 6 (six) hours as needed for mild pain.   06/07/2021  ? IRON PO Take 1 tablet by mouth. 3 x week   06/07/2021  ? Multiple Vitamin (MULTIVITAMIN PO) Take by mouth. Chewable gummy   06/07/2021  ? phentermine 30 MG capsule phentermine 30 mg capsule   Past Week  ? ? ?REVIEW OF SYSTEMS: A ROS was performed and pertinent positives and negatives are included in the history. ? GENERAL: No fevers or chills. HEENT: No change in vision, no earache, sore throat or sinus congestion. NECK: No pain or stiffness. CARDIOVASCULAR: No chest pain or pressure. No palpitations. PULMONARY: No shortness of breath, cough or wheeze. GASTROINTESTINAL: No abdominal pain, nausea, vomiting or diarrhea, melena or bright red blood per rectum. GENITOURINARY: No urinary frequency, urgency, hesitancy or dysuria. MUSCULOSKELETAL: No joint or muscle pain, no back pain, no recent trauma. DERMATOLOGIC: No rash, no itching, no lesions. ENDOCRINE: No polyuria, polydipsia, no heat or cold intolerance. No recent change in weight. HEMATOLOGICAL: No anemia or easy bruising or bleeding. NEUROLOGIC: No headache, seizures, numbness, tingling or weakness. PSYCHIATRIC: No depression, no loss of interest in normal activity or change in sleep pattern.  ? ? ? ?  Blood pressure (!) 169/94, pulse 99, temperature 98 ?F (36.7 ?C), temperature source Oral, resp. rate 16, height 5' 6.5" (1.689 m), weight 91.4 kg, SpO2 97 %. ? ?Physical Exam: ? ?Results for orders placed or performed during the hospital encounter of 06/11/21 (from the past 24 hour(s))  ?Pregnancy, urine POC     Status: None  ? Collection Time: 06/11/21  7:13 AM  ?Result Value Ref Range  ? Preg Test, Ur NEGATIVE NEGATIVE  ?CBC     Status: Abnormal  ? Collection Time: 06/11/21   7:46 AM  ?Result Value Ref Range  ? WBC 9.2 4.0 - 10.5 K/uL  ? RBC 4.91 3.87 - 5.11 MIL/uL  ? Hemoglobin 11.5 (L) 12.0 - 15.0 g/dL  ? HCT 37.4 36.0 - 46.0 %  ? MCV 76.2 (L) 80.0 - 100.0 fL  ? MCH 23.4 (L) 26.0 - 34.0 pg  ? MCHC 30.7 30.0 - 36.0 g/dL  ? RDW 19.3 (H) 11.5 - 15.5 %  ? Platelets 319 150 - 400 K/uL  ? nRBC 0.0 0.0 - 0.2 %  ? ?Pelvic US 02/09/2021: T/V images.  Retroverted uterus enlarged with small intramural and subserosal fibroids.  The largest fibroid is measured at 1.9 x 2.1 cm.  Heterogeneous myometrium with streaky shadowing suggestive of adenomyosis.  The overall uterine size is measured at 11.69 x 9.25 x 6.56 cm.  The endometrial lining is thickened and heterogeneous measured at 17.59 mm.  Hyperechoic area at the fundal portion of the cavity suspicious for a polyp.  Right ovary normal with a dominant follicle.  Left ovary with a small avascular complex follicle measured at 1.5 x 0.5 cm.  No free fluid in the posterior cul-de-sac. ?  ?  ?Assessment/Plan:  51 y.o. female  ?     ?Endometrial polyp  ?The endometrial lining is thickened and heterogeneous measured at 17.59 mm with a hypoechoic area at the fundal portion suspicious for a polyp.  Decision to proceed with a HSC/Myosure Excision/D+C. Preop preparation, Surgery and risks, as well as postop precautions and expectations thoroughly reviewed previously.   ?  ?                      Patient was counseled as to the risk of surgery to include the following: ?  ?1. Infection (prohylactic antibiotics will be administered) ?  ?2. DVT/Pulmonary Embolism (prophylactic pneumo compression stockings will be used) ?  ?3.Trauma to internal organs requiring additional surgical procedure to repair any injury to internal organs requiring perhaps additional hospitalization days. ?  ?4.Hemmorhage requiring transfusion and blood products which carry risks such as anaphylactic reaction, hepatitis and AIDS ?  ?Patient had received literature information on the  procedure scheduled and all her questions were answered and fully accepts all risk. ?  ?Courtney Bowman ?06/11/2021, 8:25 AM ?  ?

## 2021-06-11 NOTE — Anesthesia Procedure Notes (Signed)
Procedure Name: LMA Insertion ?Date/Time: 06/11/2021 8:42 AM ?Performed by: Suan Halter, CRNA ?Pre-anesthesia Checklist: Patient identified, Emergency Drugs available, Suction available and Patient being monitored ?Patient Re-evaluated:Patient Re-evaluated prior to induction ?Oxygen Delivery Method: Circle system utilized ?Preoxygenation: Pre-oxygenation with 100% oxygen ?Induction Type: IV induction ?Ventilation: Mask ventilation without difficulty ?LMA: LMA inserted ?LMA Size: 4.0 ?Number of attempts: 1 ?Airway Equipment and Method: Bite block ?Placement Confirmation: positive ETCO2 ?Tube secured with: Tape ?Dental Injury: Teeth and Oropharynx as per pre-operative assessment  ? ? ? ? ?

## 2021-06-14 ENCOUNTER — Encounter (HOSPITAL_BASED_OUTPATIENT_CLINIC_OR_DEPARTMENT_OTHER): Payer: Self-pay | Admitting: Obstetrics & Gynecology

## 2021-06-14 LAB — SURGICAL PATHOLOGY

## 2021-06-17 ENCOUNTER — Telehealth: Payer: Self-pay | Admitting: Anesthesiology

## 2021-06-17 NOTE — Telephone Encounter (Signed)
Spoke with patient, questions answered. [place] office supervisor, Butch Penny, present to answer additional questions. Patient reports no post-op concerns, still having some light bleeding. Request to r/s post-op to later date due to sons graduation. Post-op r/s to 07/15/21 at 1445.  ?

## 2021-06-17 NOTE — Telephone Encounter (Signed)
Courtney Bowman I called the pt as you requested and informed her that she needs to come in for her post-op appt. Pt said she is not able to afford to come in right now however If the visit is complementary she will come. Please advise. ?

## 2021-06-18 NOTE — Telephone Encounter (Signed)
Call to patient, no answer. Mailbox full. Unable to leave message.  ?

## 2021-06-29 NOTE — Telephone Encounter (Signed)
No return call from patient.  ? ?Appointment reminder printed and mailed to address on file. Arrival time and appt time hi-lighted.  ? ?Encounter closed.  ?

## 2021-07-01 ENCOUNTER — Encounter: Payer: 59 | Admitting: Obstetrics & Gynecology

## 2021-07-15 ENCOUNTER — Encounter: Payer: Self-pay | Admitting: Obstetrics & Gynecology

## 2021-07-15 ENCOUNTER — Ambulatory Visit (INDEPENDENT_AMBULATORY_CARE_PROVIDER_SITE_OTHER): Payer: 59 | Admitting: Obstetrics & Gynecology

## 2021-07-15 VITALS — BP 120/80

## 2021-07-15 DIAGNOSIS — Z3009 Encounter for other general counseling and advice on contraception: Secondary | ICD-10-CM | POA: Diagnosis not present

## 2021-07-15 DIAGNOSIS — Z09 Encounter for follow-up examination after completed treatment for conditions other than malignant neoplasm: Secondary | ICD-10-CM | POA: Diagnosis not present

## 2021-07-15 NOTE — Progress Notes (Signed)
? ? ?  Courtney Bowman [birth year] [id number] ? ? ?     51 y.o.  G2P2L2  ? ?RP: Postop DILATATION & CURETTAGE/HYSTEROSCOPY WITH MYOSURE EXCISION on 06/11/2021 ? ?HPI: Good healing postop.  No pelvic pain.  No vaginal bleeding or discharge.  No fever.  Not on any contraceptive. ? ? ?OB History  ?Gravida Para Term Preterm AB Living  ?2 2     0 2  ?SAB IAB Ectopic Multiple Live Births  ?           ?  ?# Outcome Date GA Lbr Len/2nd Weight Sex Delivery Anes PTL Lv  ?2 Para           ?1 Para           ? ? ?Past medical history,surgical history, problem list, medications, allergies, family history and social history were all reviewed and documented in the EPIC chart. ? ? ?Directed ROS with pertinent positives and negatives documented in the history of present illness/assessment and plan. ? ?Exam: ? ?Vitals:  ? 07/15/21 1508  ?BP: 120/80  ? ?General appearance:  Normal ? ?Abdomen: Normal ? ?Gynecologic exam: Vulva normal.  Bimanual exam:  Uterus RV, normal volume, mobile, NT.  No adnexal mass, NT. ? ?Pathology 06/11/2021: FINAL MICROSCOPIC DIAGNOSIS:  ? ?A. ENDOMETRIUM, POLYPS, CURETTAGE:  ?- Benign endometrial polyp  ?- Benign inactive endometrium  ?- Negative for hyperplasia or malignancy  ? ? ?Assessment/Plan:  51 y.o. G2P0002  ? ?1. Status post gynecological surgery, follow-up exam  ?Good postop healing.  No sign of infection.  Patho benign. ? ?2. Encounter for other general counseling or advice on contraception  ?Counseling on contraception, especially the Progestin only contraceptives such as Norethindrone BCPs and Progesterone IUDs.  Decision by patient to use condoms at this time. ? ?[name], 3:16 PM 07/15/2021 ? ? ? ?  ?

## 2021-07-19 ENCOUNTER — Encounter: Payer: 59 | Admitting: Obstetrics & Gynecology

## 2021-07-21 ENCOUNTER — Other Ambulatory Visit: Payer: Self-pay | Admitting: Nurse Practitioner

## 2021-07-21 DIAGNOSIS — Z1231 Encounter for screening mammogram for malignant neoplasm of breast: Secondary | ICD-10-CM

## 2021-09-08 ENCOUNTER — Telehealth: Payer: Self-pay | Admitting: *Deleted

## 2021-09-08 NOTE — Telephone Encounter (Signed)
Patient called c/o 2 week cycle reports bleeding since 08/25/21 was light, but then increased to flow. Passing clots, wearing pads, changing 3 pads per day. Patient said you told her in 06/2021 office visit to call if she wants BCP for progestin only pill. Patient would like to be prescribed this. Please advise

## 2021-09-16 MED ORDER — NORETHINDRONE 0.35 MG PO TABS: 1.0000 | ORAL_TABLET | Freq: Every day | ORAL | 4 refills | Status: DC

## 2021-09-16 NOTE — Telephone Encounter (Signed)
Dr.Lavoie replied "Please send the Progestin only pill, 3 packs, refill x 4. "   Unable to leave message patient mailbox is full. Rx sent.

## 2021-11-17 ENCOUNTER — Ambulatory Visit: Payer: 59

## 2021-11-24 ENCOUNTER — Ambulatory Visit: Payer: 59

## 2021-12-13 ENCOUNTER — Ambulatory Visit
Admission: RE | Admit: 2021-12-13 | Discharge: 2021-12-13 | Disposition: A | Discharge status: Home / Self Care | Payer: 59 | Source: Ambulatory Visit | Attending: Nurse Practitioner | Admitting: Nurse Practitioner

## 2021-12-13 DIAGNOSIS — Z1231 Encounter for screening mammogram for malignant neoplasm of breast: Secondary | ICD-10-CM

## 2022-02-09 ENCOUNTER — Telehealth: Payer: Self-pay | Admitting: *Deleted

## 2022-02-09 DIAGNOSIS — N926 Irregular menstruation, unspecified: Secondary | ICD-10-CM

## 2022-02-09 NOTE — Telephone Encounter (Signed)
Patient called c/o bleeding x 3 weeks now. Reports the 1st week she wore a panty liner only needed to change twice per day. 2nd week she used 3-4 pads per day for 4-5 days, this week using a panty liner and changing twice per day. Patient said she doesn't understand why she is bleeding, she never started on Micronor 0.35 mg tablet pills reports she doesn't want to take the pills at her age. Had [facility] 06/11/21.  I offered office visit, she declined. She wants to know what can be done to stop the bleeding issue permanently?  Please advise

## 2022-02-10 NOTE — Telephone Encounter (Signed)
Patient informed with Dr.Lavoie recommendation " Needs an appointment with a Pelvic US and an Endometrial Biopsy.  Will discuss surgical management.     Message sent to appointments to schedule.

## 2022-02-15 NOTE — Telephone Encounter (Signed)
Courtney Bowman, RMA Patient states she started on birth control and will try that first and if still need she will call to schedule appointment.

## 2022-05-26 ENCOUNTER — Ambulatory Visit (INDEPENDENT_AMBULATORY_CARE_PROVIDER_SITE_OTHER): Payer: 59 | Admitting: Obstetrics & Gynecology

## 2022-05-26 ENCOUNTER — Encounter: Payer: Self-pay | Admitting: Obstetrics & Gynecology

## 2022-05-26 ENCOUNTER — Other Ambulatory Visit (HOSPITAL_COMMUNITY)
Admission: RE | Admit: 2022-05-26 | Discharge: 2022-05-26 | Disposition: A | Discharge status: Home / Self Care | Payer: 59 | Source: Ambulatory Visit | Attending: Obstetrics & Gynecology | Admitting: Obstetrics & Gynecology

## 2022-05-26 VITALS — BP 120/82 | HR 74 | Resp 16 | Ht 67.25 in | Wt 206.0 lb

## 2022-05-26 DIAGNOSIS — D649 Anemia, unspecified: Secondary | ICD-10-CM

## 2022-05-26 DIAGNOSIS — N914 Secondary oligomenorrhea: Secondary | ICD-10-CM | POA: Diagnosis not present

## 2022-05-26 DIAGNOSIS — Z01419 Encounter for gynecological examination (general) (routine) without abnormal findings: Secondary | ICD-10-CM | POA: Diagnosis not present

## 2022-05-26 DIAGNOSIS — N6320 Unspecified lump in the left breast, unspecified quadrant: Secondary | ICD-10-CM

## 2022-05-26 DIAGNOSIS — N951 Menopausal and female climacteric states: Secondary | ICD-10-CM

## 2022-05-26 LAB — CBC
HCT: 31.4 % — ABNORMAL LOW (ref 35.0–45.0)
Hemoglobin: 9.4 g/dL — ABNORMAL LOW (ref 11.7–15.5)
MCH: 20.3 pg — ABNORMAL LOW (ref 27.0–33.0)
MCHC: 29.9 g/dL — ABNORMAL LOW (ref 32.0–36.0)
MCV: 67.7 fL — ABNORMAL LOW (ref 80.0–100.0)
MPV: 11 fL (ref 7.5–12.5)
Platelets: 386 10*3/uL (ref 140–400)
RBC: 4.64 10*6/uL (ref 3.80–5.10)
RDW: 19.6 % — ABNORMAL HIGH (ref 11.0–15.0)
WBC: 10.3 10*3/uL (ref 3.8–10.8)

## 2022-05-26 NOTE — Progress Notes (Signed)
Courtney Bowman 05-25-1970 [id number]   History:    52 y.o.  G2P2L2  RP:  Established patient for Annual Gyn exam  HPI: Oligomenorrhea with no menses, no BTB x mid December 2023.  Mild hot flushes.  HSC/Myosure/D+C 05/2021 Benign patho.  Pap 09/2021 Neg. Pap reflex today. Mammo 04/2021.  Left breast aspiration of cysts at 3 O'clock in 2023, benign. Colono 2022.  BMI 32.02.  Urine/BMs normal.   Past medical history,surgical history, family history and social history were all reviewed and documented in the EPIC chart.  Gynecologic History Patient's last menstrual period was 02/01/2022 (exact date).  Obstetric History OB History  Gravida Para Term Preterm AB Living  '2 2 2     2  '$ SAB IAB Ectopic Multiple Live Births               # Outcome Date GA Lbr Len/2nd Weight Sex Delivery Anes PTL Lv  2 Term           1 Term              ROS: A ROS was performed and pertinent positives and negatives are included in the history. GENERAL: No fevers or chills. HEENT: No change in vision, no earache, sore throat or sinus congestion. NECK: No pain or stiffness. CARDIOVASCULAR: No chest pain or pressure. No palpitations. PULMONARY: No shortness of breath, cough or wheeze. GASTROINTESTINAL: No abdominal pain, nausea, vomiting or diarrhea, melena or bright red blood per rectum. GENITOURINARY: No urinary frequency, urgency, hesitancy or dysuria. MUSCULOSKELETAL: No joint or muscle pain, no back pain, no recent trauma. DERMATOLOGIC: No rash, no itching, no lesions. ENDOCRINE: No polyuria, polydipsia, no heat or cold intolerance. No recent change in weight. HEMATOLOGICAL: No anemia or easy bruising or bleeding. NEUROLOGIC: No headache, seizures, numbness, tingling or weakness. PSYCHIATRIC: No depression, no loss of interest in normal activity or change in sleep pattern.     Exam:   BP 120/82   Pulse 74   Resp 16   Ht 5' 7.25" (1.708 m)   Wt 206 lb (93.4 kg)   LMP 02/01/2022 (Exact Date) Comment:  bleeding for almost 87mh in november & nothing since then-not sexually active  BMI 32.02 kg/m   Body mass index is 32.02 kg/m.  General appearance : Well developed well nourished female. No acute distress HEENT: Eyes: no retinal hemorrhage or exudates,  Neck supple, trachea midline, no carotid bruits, no thyroidmegaly Lungs: Clear to auscultation, no rhonchi or wheezes, or rib retractions  Heart: Regular rate and rhythm, no murmurs or gallops Breast:Examined in sitting and supine position were symmetrical in appearance, no palpable masses or tenderness on Rt Breast.    Left breast mass 4 x 4 cm solid, mobile at 12 O'Clock.    No skin retraction, no nipple inversion, no nipple discharge, no skin discoloration, no axillary or supraclavicular lymphadenopathy Abdomen: no palpable masses or tenderness, no rebound or guarding Extremities: no edema or skin discoloration or tenderness  Pelvic: Vulva: Normal             Vagina: No gross lesions or discharge  Cervix: No gross lesions or discharge.  Pap reflex done.  Uterus  AV, normal size, shape and consistency, non-tender and mobile  Adnexa  Without masses or tenderness  Anus: Normal   Assessment/Plan:  52y.o. female for annual exam   1. Encounter for routine gynecological examination with Papanicolaou smear of cervix Oligomenorrhea with no menses, no BTB x mid December 2023.  Mild hot flushes.  HSC/Myosure/D+C 05/2021 Benign patho.  Pap 09/2021 Neg. Pap reflex today. Mammo 04/2021.  Left breast aspiration of cysts at 3 O'clock in 2023, benign. Colono 2022.  BMI 32.02.  Urine/BMs normal.  - Cytology - PAP( [facility])  2. Secondary oligomenorrhea Probably perimenopausal.  FSH today. - FSH  3. Perimenopause Hot flushes x no menses since mid December 2023. - [facility]  4. Large mass of left breast at 12 O'Clock Left breast mass 4 x 4 cm solid, mobile at 12 O'Clock.  Lt Dx mammo/US will be scheduled.  5. Secondary anemia - CBC    [name], 4:00 PM

## 2022-05-27 ENCOUNTER — Other Ambulatory Visit: Payer: Self-pay | Admitting: Obstetrics & Gynecology

## 2022-05-27 ENCOUNTER — Telehealth: Payer: Self-pay

## 2022-05-27 DIAGNOSIS — N951 Menopausal and female climacteric states: Secondary | ICD-10-CM

## 2022-05-27 DIAGNOSIS — N6320 Unspecified lump in the left breast, unspecified quadrant: Secondary | ICD-10-CM

## 2022-05-27 LAB — FOLLICLE STIMULATING HORMONE: FSH: 81.4 m[IU]/mL

## 2022-05-27 NOTE — Telephone Encounter (Signed)
Pt scheduled on 07/11/2022 @ 840. Will call pt to notify her and make her aware of their cancellation policy in mean time. OK to offer pt appt at [facility] also? Please advise.

## 2022-05-27 NOTE — Telephone Encounter (Signed)
Per ML: "Yes to [facility] if [employer] w/ pt and she is agreeable to [place] sending order to [facility] to check for an earlier appt.   Advised pt to notify us once she finds out and decides. She's voiced understanding.

## 2022-05-27 NOTE — Telephone Encounter (Signed)
-----   Message from [name], MD sent at 05/26/2022  4:17 PM EST ----- Regarding: Left Dx mammo/US Large left solid mobile mass 4 x 4 cm at 12 O'Clock.  Lt Dx mammo/Lt breast US.

## 2022-05-30 NOTE — Telephone Encounter (Signed)
Imaging order was successfully faxed to [place] on 05/27/2022.

## 2022-05-31 LAB — CYTOLOGY - PAP
Diagnosis: NEGATIVE
Diagnosis: REACTIVE

## 2022-06-03 ENCOUNTER — Encounter: Payer: Self-pay | Admitting: Obstetrics & Gynecology

## 2022-06-21 ENCOUNTER — Telehealth: Payer: Self-pay

## 2022-06-21 NOTE — Telephone Encounter (Signed)
Patient called stating she was denied for life insurance because of Megestrol prescribed 02/05/2001.  She said "I told you all to take that off my chart" as she never took it.    "I need someone to call me back asap to get this off my record."

## 2022-06-21 NOTE — Telephone Encounter (Addendum)
Call placed to patient, Courtney Bowman, Engineer, building services present for call. Length of call 35 min.  Reviewed request with patient.  Advised per review of EPIC telephone encounter dated 02/05/21, Megace prescribed for irregular vaginal bleeding. Patient states she never started medication. OV 02/09/2021 Dr. Dellis Filbert documented "declined Megace". Advised patient medication was recommended and prescribed and later discontinued, unable to remove medication history from medical records.   Patient has access to MyChart, patient will review OV notes and request copy of medical records. [facility] Medical release sent to patient at email provided.    Routing to provider for final review. Will close encounter.

## 2022-06-22 NOTE — Telephone Encounter (Addendum)
Letter explaining reason and Dx for megace signed by Dr. Dellis Filbert.   Call returned to patient, Left message to call Sharee Pimple, RN at [facility], [phone], option 5.

## 2022-06-22 NOTE — Telephone Encounter (Signed)
Letter pended.  Copy to Dr. Lavoie.  

## 2022-06-29 NOTE — Telephone Encounter (Signed)
Pt was seen @ [facility] on 06/02/2022 and results have been received.   Pt notified to cancel appt via detailed VM per DPR for [facility] so that she is not charged a 24hr cancellation or no show fee.   Will route to provider for final review and close encounter.

## 2022-07-01 NOTE — Telephone Encounter (Signed)
No response from patient.  MyChart letter was sent via MyChart on 06/22/22.   Routing to Dr. Kearney Hard.  Encounter previously closed.

## 2022-07-01 NOTE — Addendum Note (Signed)
Addended by: Leda Min on: 07/01/2022 11:02 AM   Modules accepted: Orders

## 2022-07-02 IMAGING — MG MM DIGITAL DIAGNOSTIC UNILAT*L* W/ TOMO W/ CAD
6 series · 6 of 18 positions shown · non-contrast
Comparison: Previous exam(s).

CLINICAL DATA: Mass felt by the patient in the upper outer left
breast for 1.5 months. Previously demonstrated left breast cysts.

EXAM:
DIGITAL DIAGNOSTIC UNILATERAL LEFT MAMMOGRAM WITH TOMOSYNTHESIS AND
CAD; ULTRASOUND LEFT BREAST LIMITED
TECHNIQUE: Left digital diagnostic mammography and breast tomosynthesis was
performed. The images were evaluated with computer-aided detection.;
Targeted ultrasound examination of the left breast was performed

[L CC synth-2D (1 of 2)]
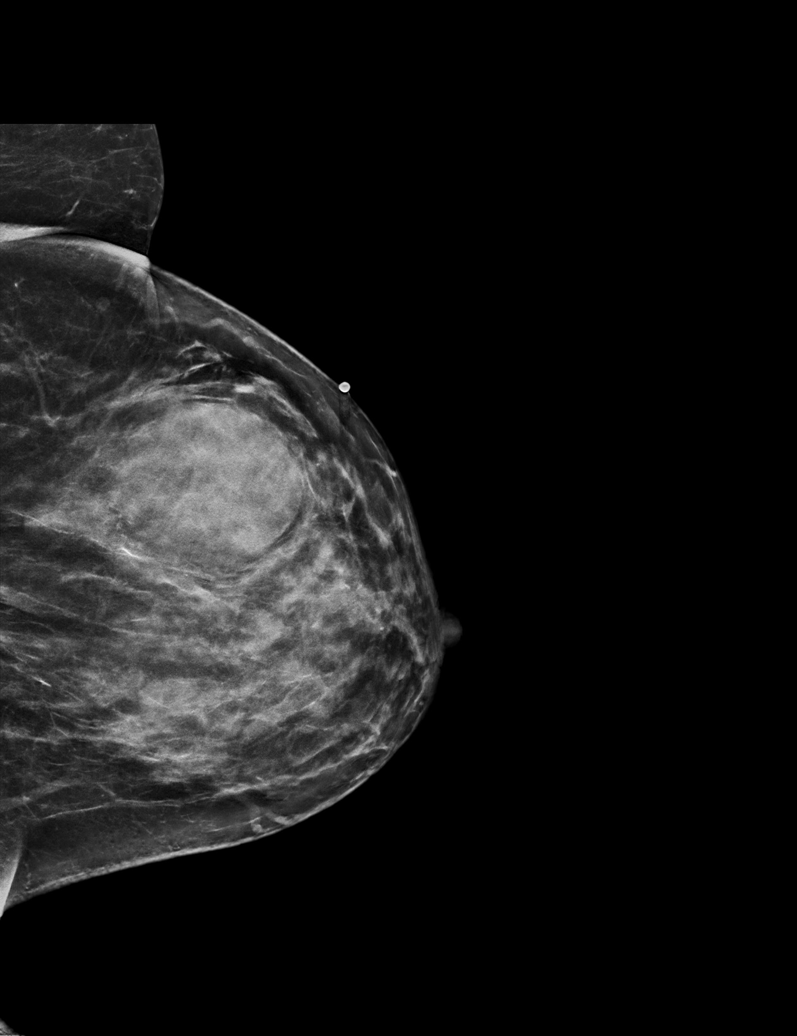

[L CC synth-2D (2 of 2)]
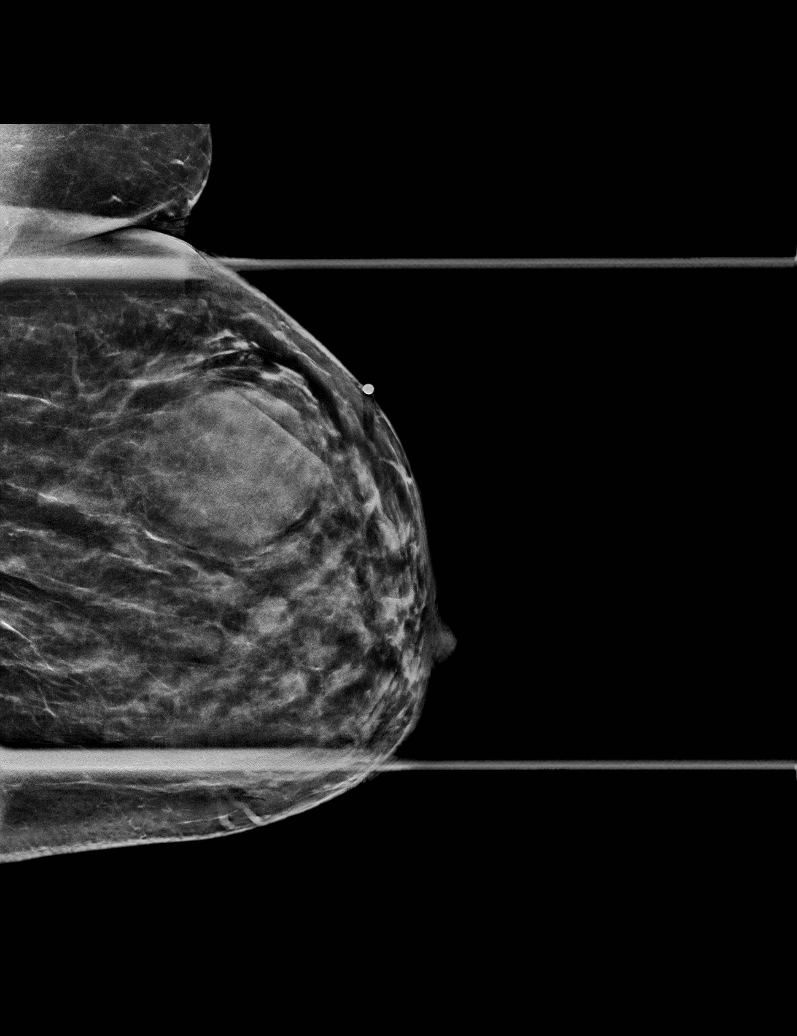

[L MLO synth-2D]
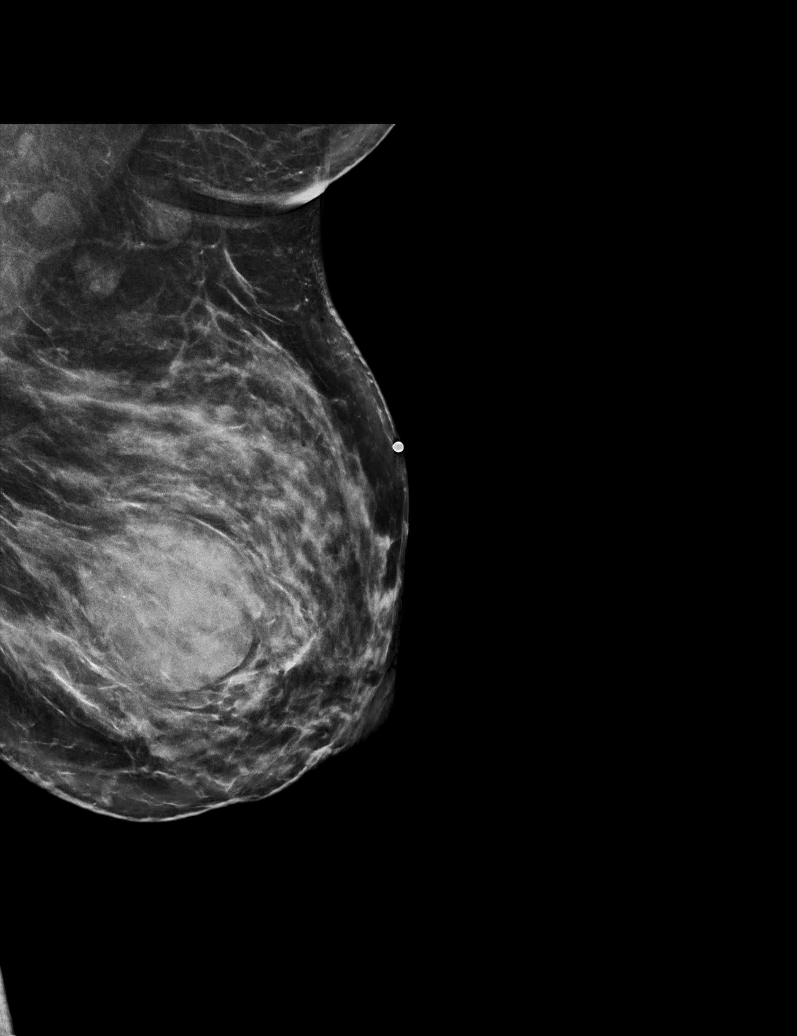

[L CC tomo (1 of 2) · tomo slice 33/65.0]
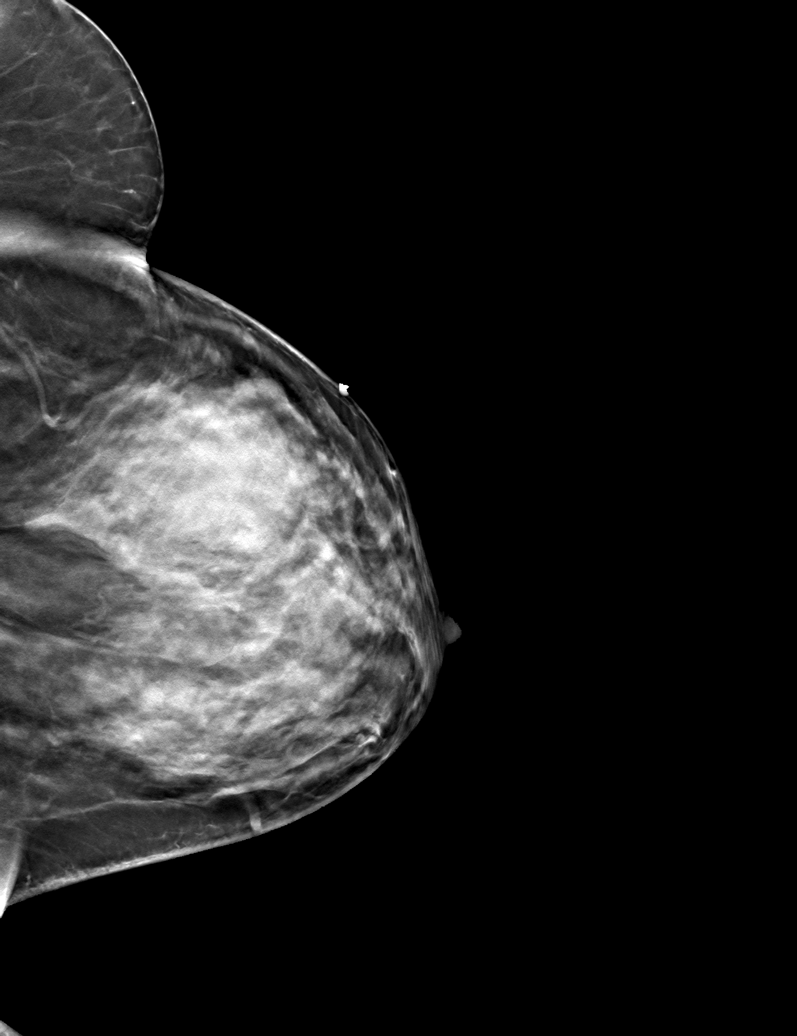

[L MLO tomo · tomo slice 33/66.0]
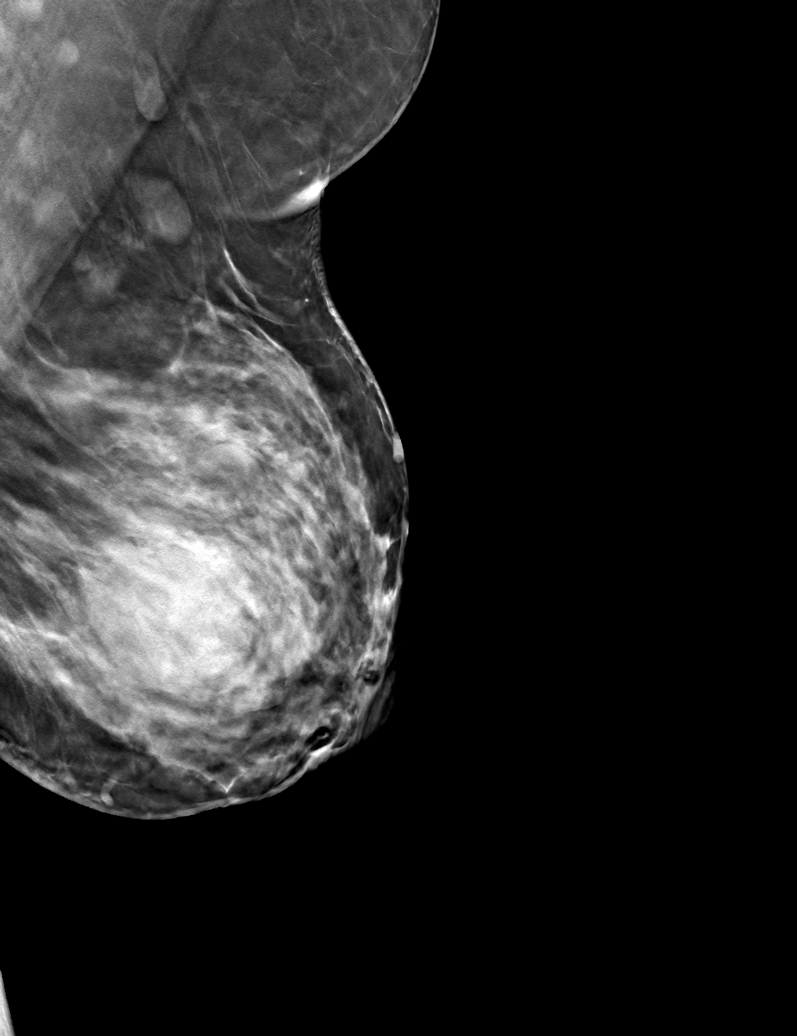

[L CC tomo (2 of 2) · tomo slice 35/69.0]
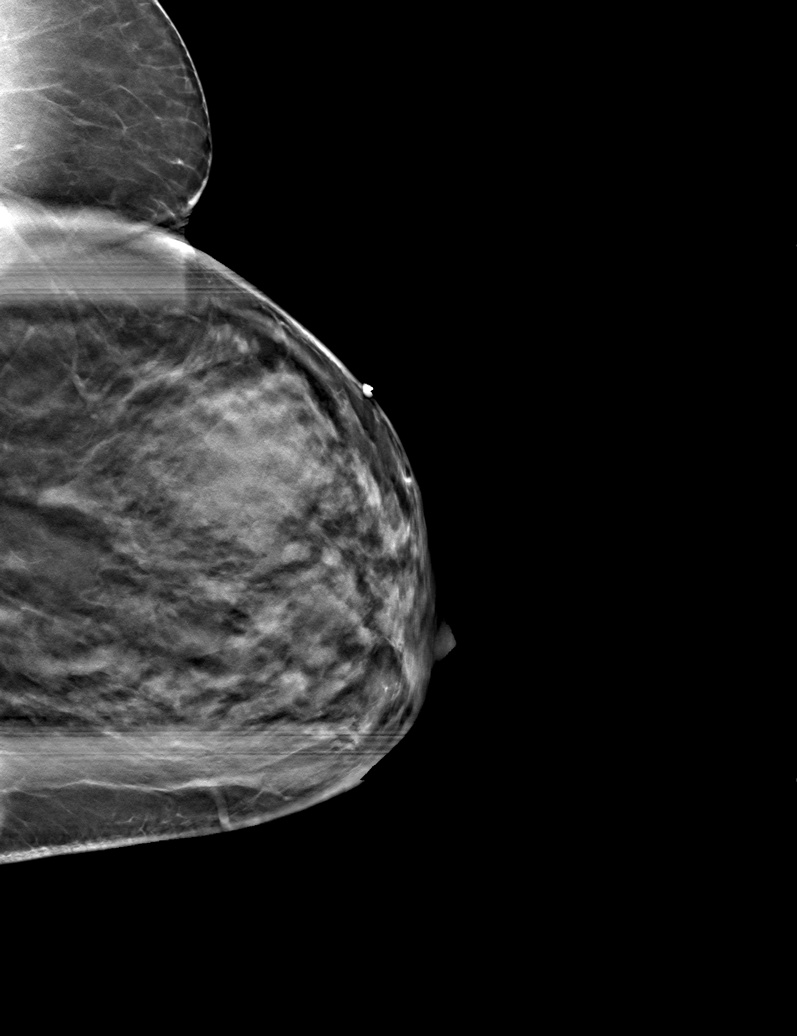

[6 of 18 positions shown; findings below may reference images not displayed]

ACR Breast Density Category c: The breast tissue is heterogeneously
dense, which may obscure small masses.
FINDINGS: Multiple oval, circumscribed masses are again demonstrated in the
left breast. The largest corresponds to the palpable mass, marked
with a metallic marker. No interval findings suspicious for
malignancy.

On physical exam, the patient has an approximately 4 cm rounded,
firm, palpable mass in the 3 o'clock position of the left breast, 3
cm from the nipple. This is mobile and nontender to palpation.

Targeted ultrasound is performed, showing a 4.2 cm simple cyst in
the 3 o'clock position of the left breast, 3 cm from the nipple.
This corresponds to the palpable mass.
IMPRESSION: 1. 4.2 cm simple cyst in the 3 o'clock position of the left breast.
2. No evidence of malignancy.

RECOMMENDATION:
Bilateral screening mammogram in 2 months when due. The patient
requested left breast cyst aspiration due to the large size of the
cyst in the left breast causing mental distress. Therefore, this was
scheduled at [DATE] p.m. on 09/16/2020.

I have discussed the findings and recommendations with the patient.
If applicable, a reminder letter will be sent to the patient
regarding the next appointment.

BI-RADS CATEGORY  2: Benign.

## 2022-07-11 ENCOUNTER — Other Ambulatory Visit: Payer: 59

## 2022-10-03 ENCOUNTER — Encounter: Payer: Self-pay | Admitting: Obstetrics & Gynecology

## 2023-05-31 ENCOUNTER — Ambulatory Visit (INDEPENDENT_AMBULATORY_CARE_PROVIDER_SITE_OTHER): Payer: 59 | Admitting: Radiology

## 2023-05-31 ENCOUNTER — Encounter: Payer: Self-pay | Admitting: Radiology

## 2023-05-31 VITALS — BP 138/86 | HR 102 | Ht 66.5 in | Wt 214.8 lb

## 2023-05-31 DIAGNOSIS — Z1331 Encounter for screening for depression: Secondary | ICD-10-CM

## 2023-05-31 DIAGNOSIS — N921 Excessive and frequent menstruation with irregular cycle: Secondary | ICD-10-CM | POA: Diagnosis not present

## 2023-05-31 DIAGNOSIS — Z01419 Encounter for gynecological examination (general) (routine) without abnormal findings: Secondary | ICD-10-CM

## 2023-05-31 NOTE — Progress Notes (Signed)
 Courtney Bowman [birth year] [id number]   History:  54 y.o. G2P2 presents for annual exam.Complains of heavy, irregular periods. LMP 05/22/23 PMP: 05/03/23. Had D&C with polypectomy 05/2021 benign pathology. Skipped 6 months of periods in 2024 and they came back heavy and frequent.   Gynecologic History Patient's last menstrual period was 05/22/2023 (approximate). Period Cycle (Days):  (varies) Period Duration (Days): 7 Period Pattern: (!) Irregular Menstrual Flow: Heavy (passes clots) Menstrual Control: Maxi pad, Thin pad Dysmenorrhea: (!) Severe Dysmenorrhea Symptoms: Cramping Contraception/Family planning: abstinence Sexually active: no Last Pap: 04/2022. Results were: normal Last mammogram: 05/2022. Results were: normal  Obstetric History OB History  Gravida Para Term Preterm AB Living  2 2 2   2   SAB IAB Ectopic Multiple Live Births          # Outcome Date GA Lbr Len/2nd Weight Sex Type Anes PTL Lv  2 Term           1 Term                05/31/2023    4:09 PM  Depression screen PHQ 2/9  Decreased Interest 0  Down, Depressed, Hopeless 0  PHQ - 2 Score 0     The following portions of the patient's history were reviewed and updated as appropriate: allergies, current medications, past family history, past medical history, past social history, past surgical history, and problem list.  Review of Systems  Constitutional: Negative.   Cardiovascular: Negative.   Gastrointestinal:  Negative for abdominal pain, blood in stool, constipation, nausea and vomiting.  Genitourinary: Negative.  Negative for dysuria, flank pain, frequency, hematuria and urgency.  Endo/Heme/Allergies:  Does not bruise/bleed easily.  Psychiatric/Behavioral: Negative.  Negative for depression, memory loss and substance abuse. The patient is not nervous/anxious and does not have insomnia.   All other systems reviewed and are negative.   Past medical history, past surgical history, family history and  social history were all reviewed and documented in the EPIC chart.  Exam:  Vitals:   05/31/23 1605  BP: 138/86  Pulse: (!) 102  SpO2: 90%  Weight: 214 lb 12.8 oz (97.4 kg)  Height: 5' 6.5" (1.689 m)   Body mass index is 34.15 kg/m.  Physical Exam Vitals and nursing note reviewed. Exam conducted with a chaperone present.  Constitutional:      Appearance: Normal appearance. She is normal weight.  HENT:     Head: Normocephalic and atraumatic.  Neck:     Thyroid: No thyroid mass, thyromegaly or thyroid tenderness.  Cardiovascular:     Rate and Rhythm: Regular rhythm.     Heart sounds: Normal heart sounds.  Pulmonary:     Effort: Pulmonary effort is normal.     Breath sounds: Normal breath sounds.  Chest:  Breasts:    Breasts are symmetrical.     Right: Normal. No inverted nipple, mass, nipple discharge, skin change or tenderness.     Left: Normal. No inverted nipple, mass, nipple discharge, skin change or tenderness.  Abdominal:     General: Abdomen is flat. Bowel sounds are normal.     Palpations: Abdomen is soft.  Genitourinary:    General: Normal vulva.     Vagina: Bleeding present. No vaginal discharge or lesions.     Cervix: Normal. No discharge or lesion.     Uterus: Normal. Not enlarged and not tender.      Adnexa: Right adnexa normal and left adnexa normal.       Right:  No mass, tenderness or fullness.         Left: No mass, tenderness or fullness.    Lymphadenopathy:     Upper Body:     Right upper body: No axillary adenopathy.     Left upper body: No axillary adenopathy.  Skin:    General: Skin is warm and dry.  Neurological:     Mental Status: She is alert and oriented to person, place, and time.  Psychiatric:        Mood and Affect: Mood normal.        Thought Content: Thought content normal.        Judgment: Judgment normal.      Raynelle Fanning, CMA present for exam  Assessment/Plan:   1. Well woman exam with routine gynecological exam  (Primary) Schedule mammogram Pap up to date Colonoscopy up to date Establish with PCP for wellness labs  2. Menorrhagia with irregular cycle - US Transvaginal Non-OB; Future - CBC - FSH - Iron, TIBC and Ferritin Panel     Courtney Bowman B WHNP-BC 4:26 PM 05/31/2023

## 2023-06-01 ENCOUNTER — Telehealth: Payer: Self-pay | Admitting: *Deleted

## 2023-06-01 LAB — IRON,TIBC AND FERRITIN PANEL
%SAT: 17 % (ref 16–45)
Ferritin: 15 ng/mL — ABNORMAL LOW (ref 16–232)
Iron: 64 ug/dL (ref 45–160)
TIBC: 373 ug/dL (ref 250–450)

## 2023-06-01 LAB — CBC
HCT: 41.4 % (ref 35.0–45.0)
Hemoglobin: 13 g/dL (ref 11.7–15.5)
MCH: 26.4 pg — ABNORMAL LOW (ref 27.0–33.0)
MCHC: 31.4 g/dL — ABNORMAL LOW (ref 32.0–36.0)
MCV: 84 fL (ref 80.0–100.0)
MPV: 10.9 fL (ref 7.5–12.5)
Platelets: 290 10*3/uL (ref 140–400)
RBC: 4.93 10*6/uL (ref 3.80–5.10)
RDW: 14.2 % (ref 11.0–15.0)
WBC: 8.9 10*3/uL (ref 3.8–10.8)

## 2023-06-01 LAB — FOLLICLE STIMULATING HORMONE: FSH: 48.5 m[IU]/mL

## 2023-06-01 NOTE — Telephone Encounter (Signed)
 Call returned to patient. Patient requesting to discuss results and recommendations from 05/31/23 labs. Advised per Jami.   Patient request to cancel PUS and consult scheduled for 06/27/23. Patient states she would prefer to monitor bleeding and if irregular cycles continue she will return call to schedule.   Patient request call for results in future, not MyChart messages.  Advised I will forward to [facility] for update. Our office will return call if any additional recommendations. PUS and consult cancelled.   Routing to provider for final review. Patient is agreeable to disposition. Will close encounter.

## 2023-06-27 ENCOUNTER — Other Ambulatory Visit

## 2023-06-27 ENCOUNTER — Ambulatory Visit: Admitting: Obstetrics and Gynecology

## 2023-08-07 ENCOUNTER — Encounter: Payer: Self-pay | Admitting: Obstetrics and Gynecology

## 2024-01-25 ENCOUNTER — Telehealth: Payer: Self-pay | Admitting: *Deleted

## 2024-01-25 NOTE — Telephone Encounter (Signed)
 Call returned to patient. Patient reports white painful bumps in left axilla. States this has been an ongoing issue, now bumps are painful, open and draining, odor present. Denies fever/chills. She is uncertain if the tissue from the breast is rubbing into the axilla area. No shaving in 3 months. Has been using alcohol and baking soda to treat with no relief.   Screening MMG at [facility] 06/2023. BiRads 2, benign.   Recommended OV, patient declines at this time. Patient states she wants to know if [place] can treat or drain the bumps. Advised we would not drain the bumps in the office, evaluation needed to determine if imaging needed, treatment or referral. Patient states she is going to contact her dermatologist to see if she can be seen there or if the dermatologist will treat the bumps. Patient wants Jami's recommendations prior to making an appointment.   Advised I will send for Jami to review and our office will f/u. Patient agreeable.   Routing to Jami

## 2024-01-25 NOTE — Telephone Encounter (Signed)
 I would recommend dermatology visit.

## 2024-01-25 NOTE — Telephone Encounter (Signed)
 Left message to call back.

## 2024-01-26 NOTE — Telephone Encounter (Signed)
 Spoke with patient. Patient states she is scheduled to her dermatologist on Monday. Patient appreciative of call.   Encounter closed.
